# Patient Record
Sex: Female | Born: 1990 | Race: White | Hispanic: No | Marital: Married | Smoking: Former smoker
Health system: Southern US, Community
[De-identification: ages and names within clinical notes are randomized; demographics above are authoritative.]

## PROBLEM LIST (undated history)

## (undated) DIAGNOSIS — F32A Depression, unspecified: Secondary | ICD-10-CM

## (undated) DIAGNOSIS — D649 Anemia, unspecified: Secondary | ICD-10-CM

## (undated) DIAGNOSIS — O4290 Premature rupture of membranes, unspecified as to length of time between rupture and onset of labor, unspecified weeks of gestation: Secondary | ICD-10-CM

## (undated) DIAGNOSIS — O24419 Gestational diabetes mellitus in pregnancy, unspecified control: Secondary | ICD-10-CM

## (undated) DIAGNOSIS — Z30430 Encounter for insertion of intrauterine contraceptive device: Secondary | ICD-10-CM

## (undated) DIAGNOSIS — F329 Major depressive disorder, single episode, unspecified: Secondary | ICD-10-CM

## (undated) HISTORY — DX: Gestational diabetes mellitus in pregnancy, unspecified control: O24.419

## (undated) HISTORY — DX: Depression, unspecified: F32.A

## (undated) HISTORY — DX: Anemia, unspecified: D64.9

## (undated) HISTORY — DX: Encounter for insertion of intrauterine contraceptive device: Z30.430

## (undated) HISTORY — DX: Major depressive disorder, single episode, unspecified: F32.9

---
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
## 2008-04-02 ENCOUNTER — Other Ambulatory Visit: Admission: RE | Admit: 2008-04-02 | Discharge: 2008-04-02 | Payer: Self-pay | Admitting: Obstetrics and Gynecology

## 2008-10-22 ENCOUNTER — Emergency Department (HOSPITAL_COMMUNITY): Admission: EM | Admit: 2008-10-22 | Discharge: 2008-10-22 | Payer: Self-pay | Admitting: Family Medicine

## 2008-12-14 ENCOUNTER — Emergency Department (HOSPITAL_COMMUNITY): Admission: EM | Admit: 2008-12-14 | Discharge: 2008-12-14 | Payer: Self-pay | Admitting: Emergency Medicine

## 2010-03-13 ENCOUNTER — Emergency Department (HOSPITAL_COMMUNITY): Admission: EM | Admit: 2010-03-13 | Discharge: 2010-03-13 | Payer: Self-pay | Admitting: Family Medicine

## 2010-11-29 LAB — URINE CULTURE: Colony Count: >=100000

## 2010-11-29 LAB — POCT URINALYSIS DIP (DEVICE)
Bilirubin Urine: NEGATIVE
Glucose, UA: NEGATIVE mg/dL
Ketones, ur: NEGATIVE mg/dL
Nitrite: POSITIVE — AB
Protein, ur: >=300 mg/dL — AB
Specific Gravity, Urine: >=1.03 (ref 1.005–1.030)
Urobilinogen, UA: 0.2 mg/dL (ref 0.0–1.0)
pH: 6 (ref 5.0–8.0)

## 2010-11-29 LAB — POCT PREGNANCY, URINE: Preg Test, Ur: NEGATIVE

## 2010-12-23 LAB — POCT RAPID STREP A (OFFICE): Streptococcus, Group A Screen (Direct): NEGATIVE

## 2011-01-29 NOTE — Discharge Summary (Signed)
[place]. [facility]  Patient:    Priscilla Ryan, STRANG Visit Number: [id number] MRN: [id number]          Service Type: TRA Location: PEDS 6149 01 Attending Physician:  Trauma, Md Dictated by:   Eugenia Pancoast, P.A. Admit Date:  06/17/2001 Discharge Date: 06/18/2001   CC:         Sandria Bales. Ezzard Standing, M.D., admitting  Fonnie Mu, M.D., consulting   Discharge Summary  DATE OF BIRTH:  [birth year].  FINAL DIAGNOSES: 1. Closed head injury. 2. Post concussive.  HISTORY OF PRESENT ILLNESS:  This is an 20 year old female who was on a trailer and fell off the back of the trailer, hitting her head.  She had loss of consciousness when seen.  The patient does not remember the accident. She came to the emergency room in stable condition, although she vomited several times. CT scan of the head was negative.  She did have signs of postconcussive state with the nausea and vomiting.  Subsequently, she was admitted overnight.  HOSPITAL COURSE:  Fonnie Mu, M.D., was consulted to follow up with the patient.  He saw the patient and followed her during her stay and she would follow with him post discharge.  The patient was doing well on June 18, 2001.  At this time she is up and ambulating without difficulty. The nausea and vomiting has resolved.  She did eat breakfast without any problem.  She did not have any numbness or weakness of any note. Subsequently, at this time she presented for discharge.  FOLLOW-UP:  At the time of discharge, she was told to follow up with Dr. Clarene Duke within one week.  DISCHARGE INSTRUCTIONS:  The patient rides horses. She was told not to ride a horse until seen by Dr. Clarene Duke.  DISPOSITION:  The patient was subsequently discharged home on June 18, 2001, in satisfactory and stable condition.  DISCHARGE MEDICATIONS:  The patient is to take Tylenol p.r.n. for headache. Dictated by:   Eugenia Pancoast, P.A. Attending  Physician:  Trauma, Md DD:  06/18/01 TD:  06/19/01 Job: [id number]

## 2011-01-29 NOTE — H&P (Signed)
[place]. [facility]  Patient:    Priscilla Ryan, EDGIN Visit Number: [id number] MRN: [id number]          Service Type: TRA Location: [place] Attending Physician:  Trauma, Md Dictated by:   Sandria Bales. Ezzard Standing, M.D. Admit Date:  06/17/2001   CC:         Fonnie Mu, M.D.   History and Physical  DATE OF BIRTH:  03/20/1991.  HISTORY OF PRESENT ILLNESS:  This is an 20 year old white female patient of Dr. Thurston Pounds who fell off the back of a tractor, kind of [place] on a farm in [place].  She apparently struck her head, had a loss of consciousness, and does not remember the accident.  She presented, however, to the [facility] Emergency Room in stable condition, had nausea and vomiting multiple times since presentation.  She is now really waking up and is alert, cooperative, is oriented, and can give a good history.  She is accompanied by her mother and family in the room.  PAST MEDICAL HISTORY:  ALLERGIES:  None.  MEDICATIONS:  None.  REVIEW OF SYSTEMS:  Negative for pulmonary, cardiac, gastrointestinal, urologic problems.  She has never had a loss of consciousness before.  SOCIAL HISTORY:  She is a sixth grader in school and will be 10 in about two weeks.  PHYSICAL EXAMINATION:  VITAL SIGNS:  Pulse 89, blood pressure 105/44.  HEENT:  Pupils are equal and reactive to light.  Extraocular movements are good x 6.  She has a bruise over her left eye which is tender, but she has no evidence of any kind of eye entrapment.  Her TMs are unremarkable.  Her tongue is midline.  She has no oral injury.  NECK:  Soft, supple, without pain.  PULMONARY:  Equal breath sounds without chest contusion.  CARDIAC:  Regular rate and rhythm.  ABDOMEN:  Soft without tenderness and without any bruits or contusion.  MUSCULOSKELETAL:  She has no obvious long bone injury or pain.  NEUROLOGIC:  Grossly intact to motor and sensory function in her upper and lower  extremities.  Her deep tendon reflexes in biceps and patellar tendons are 2+ and equal.,  LABORATORY DATA:  CT scan of her head is negative.  IMPRESSION:  Closed head injury with negative CT scan, post concussive syndrome with some nausea and mild headache which is rapidly resolving.  PLAN:  I spoke with the mother and plan to admit and observe her for 24 hours. Will probably discharge in a.m. if all goes well. Dictated by:   Sandria Bales. Ezzard Standing, M.D. Attending Physician:  Trauma, Md DD:  06/17/01 TD:  06/17/01 Job: [id number]

## 2012-09-13 NOTE — L&D Delivery Note (Signed)
Delivery Note At 6:47 AM a viable and healthy female was delivered via Vaginal, Spontaneous Delivery (Presentation: ; Occiput Anterior).  APGAR: 8, 9; weight P   Placenta status: Intact, Spontaneous.  Cord: 3 vessels with the following complications: None.    Anesthesia: None  Episiotomy: None Lacerations: 1st degree Suture Repair: 3.0 vicryl rapide Est. Blood Loss (mL): 400cc  Mom to postpartum.  Baby to Couplet care / Skin to Skin.  BOVARD,Jaree Trinka 07/19/2013, 7:05 AM  Br/ Depo/ [place]

## 2012-12-06 ENCOUNTER — Encounter: Payer: Self-pay | Admitting: *Deleted

## 2012-12-06 ENCOUNTER — Telehealth: Payer: Self-pay | Admitting: Nurse Practitioner

## 2012-12-06 NOTE — Telephone Encounter (Signed)
PT REQ AN APPT FOR PREGNANCY TESTING AND STD TESTING PLEASE/S

## 2012-12-07 ENCOUNTER — Ambulatory Visit (INDEPENDENT_AMBULATORY_CARE_PROVIDER_SITE_OTHER): Payer: Commercial Managed Care - PPO | Admitting: Nurse Practitioner

## 2012-12-07 ENCOUNTER — Encounter: Payer: Self-pay | Admitting: Nurse Practitioner

## 2012-12-07 VITALS — BP 102/70 | HR 72 | Resp 18 | Ht 64.5 in | Wt 126.0 lb

## 2012-12-07 DIAGNOSIS — N912 Amenorrhea, unspecified: Secondary | ICD-10-CM

## 2012-12-07 NOTE — Patient Instructions (Addendum)
Prenatal Care   WHAT IS PRENATAL CARE?   Prenatal care means health care during your pregnancy, before your baby is born. Take care of yourself and your baby by:   · Getting early prenatal care. If you know you are pregnant, or think you might be pregnant, call your caregiver as soon as possible. Schedule a visit for a general/prenatal examination.  · Getting regular prenatal care. Follow your caregiver's schedule for blood and other necessary tests. Do not miss appointments.  · Do everything you can to keep yourself and your baby healthy during your pregnancy.  · Prenatal care should include evaluation of medical, dietary, educational, psychological, and social needs for the couple and the medical, surgical, and genetic history of the family of the mother and father.  · Discuss with your caregiver:  · Your medicines, prescription, over-the-counter, and herbal medicines.  · Substance abuse, alcohol, smoking, and illegal drugs.  · Domestic abuse and violence, if present.  · Your immunizations.  · Nutrition and diet.  · Exercising.  · Environment and occupational hazards, at home and at work.  · History of sexually transmitted disease, both you and your partner.  · Previous pregnancies.  WHY IS PRENATAL CARE SO IMPORTANT?   By seeing you regularly, your caregiver has the chance to find problems early, so that they can be treated as soon as possible. Other problems might be prevented. Many studies have shown that early and regular prenatal care is important for the health of both mothers and their babies.   I AM THINKING ABOUT GETTING PREGNANT. HOW CAN I TAKE CARE OF MYSELF?   Taking care of yourself before you get pregnant helps you to have a healthy pregnancy. It also lowers your chances of having a baby born with a birth defect. Here are ways to take care of yourself before you get pregnant:   · Eat healthy foods, exercise regularly (30 minutes per day for most days of the week is best), and get enough rest and  sleep. Talk to your caregiver about what kinds of foods and exercises are best for you.  · Take 400 micrograms (mcg) of folic acid (one of the B vitamins) every day. The best way to do this is to take a daily multivitamin pill that contains this amount of folic acid. Getting enough of the synthetic (manufactured) form of folic acid every day before you get pregnant and during early pregnancy can help prevent certain birth defects. Many breakfast cereals and other grain products have folic acid added to them, but only certain cereals contain 400 mcg of folic acid per serving. Check the label on your multivitamin or cereal to find the amount of folic acid in the food.  · See your caregiver for a complete check up before getting pregnant. Make sure that you have had all your immunization shots, especially for rubella (German measles). Rubella can cause serious birth defects. Chickenpox is another illness you want to avoid during pregnancy. If you have had chickenpox and rubella in the past, you should be immune to them.  · Tell your caregiver about any prescription or non-prescription medicines (including herbal remedies) you are taking. Some medicines are not safe to take during pregnancy.  · Stop smoking cigarettes, drinking alcohol, or taking illegal drugs. Ask your caregiver for help, if you need it. You can also get help with alcohol and drugs by talking with a member of your faith community, a counselor, or a trusted friend.  · Discuss   and treat any medical, social, or psychological problems before getting pregnant.  · Discuss any history of genetic problems in the mother, father, and their families. Do genetic testing before getting pregnant, when possible.  · Discuss any physical or emotional abuse with your caregiver.  · Discuss with your caregiver if you might be exposed to harmful chemicals on your job or where you live.  · Discuss with your caregiver if you think your job or the hours you work may be  harmful and should be changed.  · The father should be involved with the decision making and with all aspects of the pregnancy, labor, and delivery.  · If you have medical insurance, make sure you are covered for pregnancy.  I JUST FOUND OUT THAT I AM PREGNANT. HOW CAN I TAKE CARE OF MYSELF?   Here are ways to take care of yourself and the precious new life growing inside you:   · Continue taking your multivitamin with 400 micrograms (mcg) of folic acid every day.  · Get early and regular prenatal care. It does not matter if this is your first pregnancy or if you already have children. It is very important to see a caregiver during your pregnancy. Your caregiver will check at each visit to make sure that you and the baby are healthy. If there are any problems, action can be taken right away to help you and the baby.  · Eat a healthy diet that includes:  · Fruits.  · Vegetables.  · Foods low in saturated fat.  · Grains.  · Calcium-rich foods.  · Drink 6 to 8 glasses of liquids a day.  · Unless your caregiver tells you not to, try to be physically active for 30 minutes, most days of the week. If you are pressed for time, you can get your activity in through 10 minute segments, three times a day.  · If you smoke, drink alcohol, or use drugs, STOP. These can cause long-term damage to your baby. Talk with your caregiver about steps to take to stop smoking. Talk with a member of your faith community, a counselor, a trusted friend, or your caregiver if you are concerned about your alcohol or drug use.  · Ask your caregiver before taking any medicine, even over-the-counter medicines. Some medicines are not safe to take during pregnancy.  · Get plenty of rest and sleep.  · Avoid hot tubs and saunas during pregnancy.  · Do not have X-rays taken, unless absolutely necessary and with the recommendation of your caregiver. A lead shield can be placed on your abdomen, to protect the baby when X-rays are taken in other parts of the  body.  · Do not empty the cat litter when you are pregnant. It may contain a parasite that causes an infection called toxoplasmosis, which can cause birth defects. Also, use gloves when working in garden areas used by cats.  · Do not eat uncooked or undercooked cheese, meats, or fish.  · Stay away from toxic chemicals like:  · Insecticides.  · Solvents (some cleaners or paint thinners).  · Lead.  · Mercury.  · Sexual relations may continue until the end of the pregnancy, unless you have a medical problem or there is a problem with the pregnancy and your caregiver tells you not to.  · Do not wear high heel shoes, especially during the second half of the pregnancy. You can lose your balance and fall.  · Do not take long trips, unless   absolutely necessary. Be sure to see your caregiver before going on the trip.  · Do not sit in one position for more than 2 hours, when on a trip.  · Take a copy of your medical records when going on a trip.  · Know where there is a hospital in the city you are visiting, in case of an emergency.  · Most dangerous household products will have pregnancy warnings on their labels. Ask your caregiver about products if you are unsure.  · Limit or eliminate your caffeine intake from coffee, tea, sodas, medicines, and chocolate.  · Many women continue working through pregnancy. Staying active might help you stay healthier. If you have a question about the safety or the hours you work at your particular job, talk with your caregiver.  · Get informed:  · Read books.  · Watch videos.  · Go to childbirth classes for you and the father.  · Talk with experienced moms.  · Ask your caregiver about childbirth education classes for you and your partner. Classes can help you and your partner prepare for the birth of your baby.  · Ask about a pediatrician (baby doctor) and methods and pain medicine for labor, delivery, and possible Cesarean delivery (C-section).  I AM NOT THINKING ABOUT GETTING PREGNANT  RIGHT NOW, BUT HEARD THAT ALL WOMEN SHOULD TAKE FOLIC ACID EVERY DAY?   All women of childbearing age, with even a remote chance of getting pregnant, should try to make sure they get enough folic acid. Many pregnancies are not planned. Many women do not know they are actually pregnant early in their pregnancies, and certain birth defects happen in the very early part of pregnancy. Taking 400 micrograms (mcg) of folic acid every day will help prevent certain birth defects that happen in the early part of pregnancy. If a woman begins taking vitamin pills in the second or third month of pregnancy, it may be too late to prevent birth defects. Folic acid may also have other health benefits for women, besides preventing birth defects.   HOW OFTEN SHOULD I SEE MY CAREGIVER DURING PREGNANCY?   Your caregiver will give you a schedule for your prenatal visits. You will have visits more often as you get closer to the end of your pregnancy. An average pregnancy lasts about 40 weeks.   A typical schedule includes visiting your caregiver:   · About once each month, during your first 6 months of pregnancy.  · Every 2 weeks, during the next 2 months.  · Weekly in the last month, until the delivery date.  Your caregiver will probably want to see you more often if:  · You are over 35.  · Your pregnancy is high risk, because you have certain health problems or problems with the pregnancy, such as:  · Diabetes.  · High blood pressure.  · The baby is not growing on schedule, according to the dates of the pregnancy.  Your caregiver will do special tests, to make sure you and the baby are not having any serious problems.  WHAT HAPPENS DURING PRENATAL VISITS?   · At your first prenatal visit, your caregiver will talk to you about you and your partner's health history and your family's health history, and will do a physical exam.  · On your first visit, a physical exam will include checks of your blood pressure, height and weight, and an  exam of your pelvic organs. Your caregiver will do a Pap test if you have   not had one recently, and will do cultures of your cervix to make sure there is no infection.  · At each visit, there will be tests of your blood, urine, blood pressure, weight, and checking the progress of the baby.  · Your caregiver will be able to tell you when to expect that your baby will be born.  · Each visit is also a chance for you to learn about staying healthy during pregnancy and for asking questions.  · Discuss whether you will be breastfeeding.  · At your later prenatal visits, your caregiver will check how you are doing and how the baby is developing. You may have a number of tests done as your pregnancy progresses.  · Ultrasound exams are often used to check on the baby's growth and health.  · You may have more urine and blood tests, as well as special tests, if needed. These may include amniocentesis (examine fluid in the pregnancy sac), stress tests (check how baby responds to contractions), biophysical profile (measures fetus well-being). Your caregiver will explain the tests and why they are necessary.  I AM IN MY LATE THIRTIES, AND I WANT TO HAVE A CHILD NOW. SHOULD I DO ANYTHING SPECIAL?   As you get older, there is more chance of having a medical problem (high blood pressure), pregnancy problem (preeclampsia, problems with the placenta), miscarriage, or a baby born with a birth defect. However, most women in their late thirties and early forties have healthy babies. See your caregiver on a regular basis before you get pregnant and be sure to go for exams throughout your pregnancy. Your caregiver probably will want to do some special tests to check on you and your baby's health when you are pregnant.   Women today are often delaying having children until later in life, when they are in their thirties and forties. While many women in their thirties and forties have no difficulty getting pregnant, fertility does decline  with age. For women over 40 who cannot get pregnant after 6 months of trying, it is recommended that they see their caregiver for a fertility evaluation. It is not uncommon to have trouble becoming pregnant or experience infertility (inability to become pregnant after trying for one year). If you think that you or your partner may be infertile, you can discuss this with your caregiver. He or she can recommend treatments such as drugs, surgery, or assisted reproductive technology.   Document Released: 09/02/2003 Document Revised: 11/22/2011 Document Reviewed: 07/30/2009  Exit[employer]® Patient Information ©2013 [employer].

## 2012-12-07 NOTE — Progress Notes (Signed)
  22 y.o.Single Caucasian female G1 ,A1 presents with no menses sinceFebruary 20, 2014  Currently periods are occurring every 28 days.   Bleeding is moderate.  Periods were regular in the past, occurring every 28 days.  Patient reports no abdominal pain, nausea/ vomiting, or vaginal bleeding . Reports no method of birth control X 3 months.   Chose not to use birth control and if pregnancy occurred OK. She has a new partner X 6 months and they are well suited for one another.  Urine pregnancy positive at home.    A comprehensive review of systems was negative.  No exam performed today and  not indicated at this time.  Assessment:    Amenorrhea with + UPT   Labs:  UPT + here  Will schedule PUS for confirmation of viable pregnancy about 12/21/12  Pt. Is given a list of OTC med's same to use in pregnancy  Discussed if any problems with pain or bleeding to call ASAP  She will start on Prenatal MVI  Given list of OB Md's - she is concerned about cost and insurance coverage during pregnancy and may need to apply for financial help

## 2012-12-08 NOTE — Progress Notes (Signed)
Encounter reviewed by Dr. Brook Silva.  

## 2012-12-18 ENCOUNTER — Other Ambulatory Visit: Payer: Self-pay | Admitting: Obstetrics and Gynecology

## 2012-12-18 DIAGNOSIS — N912 Amenorrhea, unspecified: Secondary | ICD-10-CM

## 2012-12-25 ENCOUNTER — Encounter: Payer: Self-pay | Admitting: Obstetrics and Gynecology

## 2012-12-25 ENCOUNTER — Ambulatory Visit (INDEPENDENT_AMBULATORY_CARE_PROVIDER_SITE_OTHER): Payer: 59 | Admitting: Obstetrics and Gynecology

## 2012-12-25 ENCOUNTER — Ambulatory Visit (INDEPENDENT_AMBULATORY_CARE_PROVIDER_SITE_OTHER): Payer: 59

## 2012-12-25 VITALS — BP 98/60 | Ht 63.5 in | Wt 123.0 lb

## 2012-12-25 DIAGNOSIS — N912 Amenorrhea, unspecified: Secondary | ICD-10-CM

## 2012-12-25 DIAGNOSIS — Z1389 Encounter for screening for other disorder: Secondary | ICD-10-CM

## 2012-12-25 DIAGNOSIS — O9989 Other specified diseases and conditions complicating pregnancy, childbirth and the puerperium: Secondary | ICD-10-CM

## 2012-12-25 DIAGNOSIS — Z3490 Encounter for supervision of normal pregnancy, unspecified, unspecified trimester: Secondary | ICD-10-CM

## 2012-12-25 LAB — US OB TRANSVAGINAL

## 2012-12-25 NOTE — Patient Instructions (Addendum)
Prenatal Care   WHAT IS PRENATAL CARE?   Prenatal care means health care during your pregnancy, before your baby is born. Take care of yourself and your baby by:   · Getting early prenatal care. If you know you are pregnant, or think you might be pregnant, call your caregiver as soon as possible. Schedule a visit for a general/prenatal examination.  · Getting regular prenatal care. Follow your caregiver's schedule for blood and other necessary tests. Do not miss appointments.  · Do everything you can to keep yourself and your baby healthy during your pregnancy.  · Prenatal care should include evaluation of medical, dietary, educational, psychological, and social needs for the couple and the medical, surgical, and genetic history of the family of the mother and father.  · Discuss with your caregiver:  · Your medicines, prescription, over-the-counter, and herbal medicines.  · Substance abuse, alcohol, smoking, and illegal drugs.  · Domestic abuse and violence, if present.  · Your immunizations.  · Nutrition and diet.  · Exercising.  · Environment and occupational hazards, at home and at work.  · History of sexually transmitted disease, both you and your partner.  · Previous pregnancies.  WHY IS PRENATAL CARE SO IMPORTANT?   By seeing you regularly, your caregiver has the chance to find problems early, so that they can be treated as soon as possible. Other problems might be prevented. Many studies have shown that early and regular prenatal care is important for the health of both mothers and their babies.   I AM THINKING ABOUT GETTING PREGNANT. HOW CAN I TAKE CARE OF MYSELF?   Taking care of yourself before you get pregnant helps you to have a healthy pregnancy. It also lowers your chances of having a baby born with a birth defect. Here are ways to take care of yourself before you get pregnant:   · Eat healthy foods, exercise regularly (30 minutes per day for most days of the week is best), and get enough rest and  sleep. Talk to your caregiver about what kinds of foods and exercises are best for you.  · Take 400 micrograms (mcg) of folic acid (one of the B vitamins) every day. The best way to do this is to take a daily multivitamin pill that contains this amount of folic acid. Getting enough of the synthetic (manufactured) form of folic acid every day before you get pregnant and during early pregnancy can help prevent certain birth defects. Many breakfast cereals and other grain products have folic acid added to them, but only certain cereals contain 400 mcg of folic acid per serving. Check the label on your multivitamin or cereal to find the amount of folic acid in the food.  · See your caregiver for a complete check up before getting pregnant. Make sure that you have had all your immunization shots, especially for rubella (German measles). Rubella can cause serious birth defects. Chickenpox is another illness you want to avoid during pregnancy. If you have had chickenpox and rubella in the past, you should be immune to them.  · Tell your caregiver about any prescription or non-prescription medicines (including herbal remedies) you are taking. Some medicines are not safe to take during pregnancy.  · Stop smoking cigarettes, drinking alcohol, or taking illegal drugs. Ask your caregiver for help, if you need it. You can also get help with alcohol and drugs by talking with a member of your faith community, a counselor, or a trusted friend.  · Discuss   and treat any medical, social, or psychological problems before getting pregnant.  · Discuss any history of genetic problems in the mother, father, and their families. Do genetic testing before getting pregnant, when possible.  · Discuss any physical or emotional abuse with your caregiver.  · Discuss with your caregiver if you might be exposed to harmful chemicals on your job or where you live.  · Discuss with your caregiver if you think your job or the hours you work may be  harmful and should be changed.  · The father should be involved with the decision making and with all aspects of the pregnancy, labor, and delivery.  · If you have medical insurance, make sure you are covered for pregnancy.  I JUST FOUND OUT THAT I AM PREGNANT. HOW CAN I TAKE CARE OF MYSELF?   Here are ways to take care of yourself and the precious new life growing inside you:   · Continue taking your multivitamin with 400 micrograms (mcg) of folic acid every day.  · Get early and regular prenatal care. It does not matter if this is your first pregnancy or if you already have children. It is very important to see a caregiver during your pregnancy. Your caregiver will check at each visit to make sure that you and the baby are healthy. If there are any problems, action can be taken right away to help you and the baby.  · Eat a healthy diet that includes:  · Fruits.  · Vegetables.  · Foods low in saturated fat.  · Grains.  · Calcium-rich foods.  · Drink 6 to 8 glasses of liquids a day.  · Unless your caregiver tells you not to, try to be physically active for 30 minutes, most days of the week. If you are pressed for time, you can get your activity in through 10 minute segments, three times a day.  · If you smoke, drink alcohol, or use drugs, STOP. These can cause long-term damage to your baby. Talk with your caregiver about steps to take to stop smoking. Talk with a member of your faith community, a counselor, a trusted friend, or your caregiver if you are concerned about your alcohol or drug use.  · Ask your caregiver before taking any medicine, even over-the-counter medicines. Some medicines are not safe to take during pregnancy.  · Get plenty of rest and sleep.  · Avoid hot tubs and saunas during pregnancy.  · Do not have X-rays taken, unless absolutely necessary and with the recommendation of your caregiver. A lead shield can be placed on your abdomen, to protect the baby when X-rays are taken in other parts of the  body.  · Do not empty the cat litter when you are pregnant. It may contain a parasite that causes an infection called toxoplasmosis, which can cause birth defects. Also, use gloves when working in garden areas used by cats.  · Do not eat uncooked or undercooked cheese, meats, or fish.  · Stay away from toxic chemicals like:  · Insecticides.  · Solvents (some cleaners or paint thinners).  · Lead.  · Mercury.  · Sexual relations may continue until the end of the pregnancy, unless you have a medical problem or there is a problem with the pregnancy and your caregiver tells you not to.  · Do not wear high heel shoes, especially during the second half of the pregnancy. You can lose your balance and fall.  · Do not take long trips, unless   absolutely necessary. Be sure to see your caregiver before going on the trip.  · Do not sit in one position for more than 2 hours, when on a trip.  · Take a copy of your medical records when going on a trip.  · Know where there is a hospital in the city you are visiting, in case of an emergency.  · Most dangerous household products will have pregnancy warnings on their labels. Ask your caregiver about products if you are unsure.  · Limit or eliminate your caffeine intake from coffee, tea, sodas, medicines, and chocolate.  · Many women continue working through pregnancy. Staying active might help you stay healthier. If you have a question about the safety or the hours you work at your particular job, talk with your caregiver.  · Get informed:  · Read books.  · Watch videos.  · Go to childbirth classes for you and the father.  · Talk with experienced moms.  · Ask your caregiver about childbirth education classes for you and your partner. Classes can help you and your partner prepare for the birth of your baby.  · Ask about a pediatrician (baby doctor) and methods and pain medicine for labor, delivery, and possible Cesarean delivery (C-section).  I AM NOT THINKING ABOUT GETTING PREGNANT  RIGHT NOW, BUT HEARD THAT ALL WOMEN SHOULD TAKE FOLIC ACID EVERY DAY?   All women of childbearing age, with even a remote chance of getting pregnant, should try to make sure they get enough folic acid. Many pregnancies are not planned. Many women do not know they are actually pregnant early in their pregnancies, and certain birth defects happen in the very early part of pregnancy. Taking 400 micrograms (mcg) of folic acid every day will help prevent certain birth defects that happen in the early part of pregnancy. If a woman begins taking vitamin pills in the second or third month of pregnancy, it may be too late to prevent birth defects. Folic acid may also have other health benefits for women, besides preventing birth defects.   HOW OFTEN SHOULD I SEE MY CAREGIVER DURING PREGNANCY?   Your caregiver will give you a schedule for your prenatal visits. You will have visits more often as you get closer to the end of your pregnancy. An average pregnancy lasts about 40 weeks.   A typical schedule includes visiting your caregiver:   · About once each month, during your first 6 months of pregnancy.  · Every 2 weeks, during the next 2 months.  · Weekly in the last month, until the delivery date.  Your caregiver will probably want to see you more often if:  · You are over 35.  · Your pregnancy is high risk, because you have certain health problems or problems with the pregnancy, such as:  · Diabetes.  · High blood pressure.  · The baby is not growing on schedule, according to the dates of the pregnancy.  Your caregiver will do special tests, to make sure you and the baby are not having any serious problems.  WHAT HAPPENS DURING PRENATAL VISITS?   · At your first prenatal visit, your caregiver will talk to you about you and your partner's health history and your family's health history, and will do a physical exam.  · On your first visit, a physical exam will include checks of your blood pressure, height and weight, and an  exam of your pelvic organs. Your caregiver will do a Pap test if you have   not had one recently, and will do cultures of your cervix to make sure there is no infection.  · At each visit, there will be tests of your blood, urine, blood pressure, weight, and checking the progress of the baby.  · Your caregiver will be able to tell you when to expect that your baby will be born.  · Each visit is also a chance for you to learn about staying healthy during pregnancy and for asking questions.  · Discuss whether you will be breastfeeding.  · At your later prenatal visits, your caregiver will check how you are doing and how the baby is developing. You may have a number of tests done as your pregnancy progresses.  · Ultrasound exams are often used to check on the baby's growth and health.  · You may have more urine and blood tests, as well as special tests, if needed. These may include amniocentesis (examine fluid in the pregnancy sac), stress tests (check how baby responds to contractions), biophysical profile (measures fetus well-being). Your caregiver will explain the tests and why they are necessary.  I AM IN MY LATE THIRTIES, AND I WANT TO HAVE A CHILD NOW. SHOULD I DO ANYTHING SPECIAL?   As you get older, there is more chance of having a medical problem (high blood pressure), pregnancy problem (preeclampsia, problems with the placenta), miscarriage, or a baby born with a birth defect. However, most women in their late thirties and early forties have healthy babies. See your caregiver on a regular basis before you get pregnant and be sure to go for exams throughout your pregnancy. Your caregiver probably will want to do some special tests to check on you and your baby's health when you are pregnant.   Women today are often delaying having children until later in life, when they are in their thirties and forties. While many women in their thirties and forties have no difficulty getting pregnant, fertility does decline  with age. For women over 40 who cannot get pregnant after 6 months of trying, it is recommended that they see their caregiver for a fertility evaluation. It is not uncommon to have trouble becoming pregnant or experience infertility (inability to become pregnant after trying for one year). If you think that you or your partner may be infertile, you can discuss this with your caregiver. He or she can recommend treatments such as drugs, surgery, or assisted reproductive technology.   Document Released: 09/02/2003 Document Revised: 11/22/2011 Document Reviewed: 07/30/2009  Exit[employer]® Patient Information ©2013 [employer].

## 2012-12-25 NOTE — Progress Notes (Signed)
  SUBJECTIVE  Patient here for ultrasound to document viability of pregnancy.  States some nausea.  Able to tolerate PNV.  OBJECTIVE  See ultrasound below.  Viable IUP - 6 + 6 weeks.  EDC 08/14/13.  ASSESSMENT  Viable IUP. Nausea of early pregnancy.  PLAN  Patient will select OB office.   I discussed avoidance of exposures with patient. Patient will try Vitamin B6 25 mg, 1/2 tab daily for nausea. Return to office here in 1 year.

## 2013-01-01 ENCOUNTER — Encounter: Payer: Self-pay | Admitting: *Deleted

## 2013-01-11 LAB — OB RESULTS CONSOLE RPR: RPR: NONREACTIVE

## 2013-01-11 LAB — OB RESULTS CONSOLE GC/CHLAMYDIA
Chlamydia: NEGATIVE
Gonorrhea: NEGATIVE

## 2013-01-11 LAB — OB RESULTS CONSOLE HEPATITIS B SURFACE ANTIGEN: Hepatitis B Surface Ag: NEGATIVE

## 2013-01-11 LAB — OB RESULTS CONSOLE ANTIBODY SCREEN: Antibody Screen: NEGATIVE

## 2013-01-25 ENCOUNTER — Ambulatory Visit: Payer: Self-pay | Admitting: Nurse Practitioner

## 2013-02-20 ENCOUNTER — Ambulatory Visit: Payer: Self-pay | Admitting: Nurse Practitioner

## 2013-06-27 ENCOUNTER — Encounter: Payer: 59 | Attending: Obstetrics and Gynecology

## 2013-06-27 VITALS — Ht 65.0 in | Wt 149.1 lb

## 2013-06-27 DIAGNOSIS — Z713 Dietary counseling and surveillance: Secondary | ICD-10-CM | POA: Insufficient documentation

## 2013-06-27 DIAGNOSIS — O99810 Abnormal glucose complicating pregnancy: Secondary | ICD-10-CM | POA: Insufficient documentation

## 2013-06-28 NOTE — Progress Notes (Signed)
  Patient was seen on 06/27/13 for Gestational Diabetes self-management class at the [facility]. The following learning objectives were met by the patient during this course:   States the definition of Gestational Diabetes  States why dietary management is important in controlling blood glucose  Describes the effects of carbohydrates on blood glucose levels  Demonstrates ability to create a balanced meal plan  Demonstrates carbohydrate counting   States when to check blood glucose levels  Demonstrates proper blood glucose monitoring techniques  States the effect of stress and exercise on blood glucose levels  States the importance of limiting caffeine and abstaining from alcohol and smoking  Plan:  Aim for 2 Carb Choices per meal (30 grams) +/- 1 either way for breakfast Aim for 3 Carb Choices per meal (45 grams) +/- 1 either way from lunch and dinner Aim for 1-2 Carbs per snack Begin reading food labels for Total Carbohydrate and sugar grams of foods Consider  increasing your activity level by walking daily as tolerated Begin checking BG before breakfast and 1-2 hours after first bit of breakfast, lunch and dinner after  as directed by MD  Take medication  as directed by MD  Blood glucose monitor given: Accu Chek Nano BG Monitoring Kit Lot # [id number] Exp: 08/12/14 Blood glucose reading: 97  Patient instructed to monitor glucose levels: FBS: 60 - <90 1 hour: <140 2 hour: <120  Patient received the following handouts:  Nutrition Diabetes and Pregnancy  Carbohydrate Counting List  Meal Planning worksheet  Patient will be seen for follow-up as needed.

## 2013-07-12 LAB — OB RESULTS CONSOLE GBS: GBS: NEGATIVE

## 2013-07-18 ENCOUNTER — Inpatient Hospital Stay (HOSPITAL_COMMUNITY)
Admission: AD | Admit: 2013-07-18 | Discharge: 2013-07-21 | DRG: 775 | Disposition: A | Discharge status: Home / Self Care | Payer: 59 | Source: Ambulatory Visit | Attending: Obstetrics and Gynecology | Admitting: Obstetrics and Gynecology

## 2013-07-18 ENCOUNTER — Encounter (HOSPITAL_COMMUNITY): Payer: Self-pay

## 2013-07-18 DIAGNOSIS — O99814 Abnormal glucose complicating childbirth: Principal | ICD-10-CM | POA: Diagnosis present

## 2013-07-18 DIAGNOSIS — O4290 Premature rupture of membranes, unspecified as to length of time between rupture and onset of labor, unspecified weeks of gestation: Secondary | ICD-10-CM

## 2013-07-18 HISTORY — DX: Premature rupture of membranes, unspecified as to length of time between rupture and onset of labor, unspecified weeks of gestation: O42.90

## 2013-07-18 NOTE — MAU Note (Signed)
Several gushes of clear fluid today.

## 2013-07-19 ENCOUNTER — Encounter (HOSPITAL_COMMUNITY): Payer: Self-pay | Admitting: *Deleted

## 2013-07-19 DIAGNOSIS — O4290 Premature rupture of membranes, unspecified as to length of time between rupture and onset of labor, unspecified weeks of gestation: Secondary | ICD-10-CM

## 2013-07-19 HISTORY — DX: Premature rupture of membranes, unspecified as to length of time between rupture and onset of labor, unspecified weeks of gestation: O42.90

## 2013-07-19 LAB — CBC
HCT: 33.1 % — ABNORMAL LOW (ref 36.0–46.0)
Hemoglobin: 11.4 g/dL — ABNORMAL LOW (ref 12.0–15.0)
MCH: 30.6 pg (ref 26.0–34.0)
MCHC: 34.4 g/dL (ref 30.0–36.0)
MCV: 89 fL (ref 78.0–100.0)
RBC: 3.72 MIL/uL — ABNORMAL LOW (ref 3.87–5.11)
WBC: 7 10*3/uL (ref 4.0–10.5)

## 2013-07-19 LAB — GLUCOSE, CAPILLARY
Glucose-Capillary: 104 mg/dL — ABNORMAL HIGH (ref 70–99)
Glucose-Capillary: 108 mg/dL — ABNORMAL HIGH (ref 70–99)

## 2013-07-19 MED ORDER — DIPHENHYDRAMINE HCL 50 MG/ML IJ SOLN: 12.5000 mg | INTRAMUSCULAR | Status: DC | PRN

## 2013-07-19 MED ORDER — ZOLPIDEM TARTRATE 5 MG PO TABS: 5.0000 mg | ORAL_TABLET | Freq: Every evening | ORAL | Status: DC | PRN

## 2013-07-19 MED ORDER — NALOXONE HCL 0.4 MG/ML IJ SOLN
INTRAMUSCULAR | Status: AC
  Filled 2013-07-19: qty 1

## 2013-07-19 MED ORDER — PRENATAL MULTIVITAMIN CH
1.0000 | ORAL_TABLET | Freq: Every day | ORAL | Status: DC
  Administered 2013-07-19 – 2013-07-21 (×3): 1 via ORAL
  Filled 2013-07-19 (×3): qty 1

## 2013-07-19 MED ORDER — BUTORPHANOL TARTRATE 1 MG/ML IJ SOLN
1.0000 mg | INTRAMUSCULAR | Status: DC | PRN
  Administered 2013-07-19 (×3): 1 mg via INTRAVENOUS
  Filled 2013-07-19 (×3): qty 1

## 2013-07-19 MED ORDER — LANOLIN HYDROUS EX OINT: TOPICAL_OINTMENT | CUTANEOUS | Status: DC | PRN

## 2013-07-19 MED ORDER — OXYTOCIN BOLUS FROM INFUSION
500.0000 mL | INTRAVENOUS | Status: DC
  Administered 2013-07-19: 500 mL via INTRAVENOUS

## 2013-07-19 MED ORDER — LIDOCAINE HCL (PF) 1 % IJ SOLN
30.0000 mL | INTRAMUSCULAR | Status: AC | PRN
  Administered 2013-07-19: 30 mL via SUBCUTANEOUS
  Filled 2013-07-19 (×2): qty 30

## 2013-07-19 MED ORDER — OXYTOCIN 40 UNITS IN LACTATED RINGERS INFUSION - SIMPLE MED
62.5000 mL/h | INTRAVENOUS | Status: DC
  Administered 2013-07-19: 62.5 mL/h via INTRAVENOUS
  Filled 2013-07-19: qty 1000

## 2013-07-19 MED ORDER — SENNOSIDES-DOCUSATE SODIUM 8.6-50 MG PO TABS
2.0000 | ORAL_TABLET | ORAL | Status: DC
  Administered 2013-07-20 – 2013-07-21 (×2): 2 via ORAL
  Filled 2013-07-19 (×2): qty 2

## 2013-07-19 MED ORDER — ACETAMINOPHEN 325 MG PO TABS: 650.0000 mg | ORAL_TABLET | ORAL | Status: DC | PRN

## 2013-07-19 MED ORDER — FLEET ENEMA 7-19 GM/118ML RE ENEM: 1.0000 | ENEMA | RECTAL | Status: DC | PRN

## 2013-07-19 MED ORDER — CITRIC ACID-SODIUM CITRATE 334-500 MG/5ML PO SOLN
30.0000 mL | ORAL | Status: DC | PRN
  Administered 2013-07-19: 30 mL via ORAL
  Filled 2013-07-19: qty 15

## 2013-07-19 MED ORDER — EPHEDRINE 5 MG/ML INJ
10.0000 mg | INTRAVENOUS | Status: DC | PRN
  Filled 2013-07-19: qty 2

## 2013-07-19 MED ORDER — ONDANSETRON HCL 4 MG/2ML IJ SOLN: 4.0000 mg | Freq: Four times a day (QID) | INTRAMUSCULAR | Status: DC | PRN

## 2013-07-19 MED ORDER — OXYTOCIN 40 UNITS IN LACTATED RINGERS INFUSION - SIMPLE MED
1.0000 m[IU]/min | INTRAVENOUS | Status: DC
  Administered 2013-07-19: 2 m[IU]/min via INTRAVENOUS

## 2013-07-19 MED ORDER — BUTORPHANOL TARTRATE 1 MG/ML IJ SOLN: 2.0000 mg | INTRAMUSCULAR | Status: DC | PRN

## 2013-07-19 MED ORDER — FENTANYL 2.5 MCG/ML BUPIVACAINE 1/10 % EPIDURAL INFUSION (WH - ANES): 14.0000 mL/h | INTRAMUSCULAR | Status: DC | PRN

## 2013-07-19 MED ORDER — IBUPROFEN 600 MG PO TABS
600.0000 mg | ORAL_TABLET | Freq: Four times a day (QID) | ORAL | Status: DC
  Administered 2013-07-19 – 2013-07-21 (×9): 600 mg via ORAL
  Filled 2013-07-19 (×9): qty 1

## 2013-07-19 MED ORDER — DIPHENHYDRAMINE HCL 25 MG PO CAPS: 25.0000 mg | ORAL_CAPSULE | Freq: Four times a day (QID) | ORAL | Status: DC | PRN

## 2013-07-19 MED ORDER — PHENYLEPHRINE 40 MCG/ML (10ML) SYRINGE FOR IV PUSH (FOR BLOOD PRESSURE SUPPORT)
80.0000 ug | PREFILLED_SYRINGE | INTRAVENOUS | Status: DC | PRN
  Filled 2013-07-19: qty 2

## 2013-07-19 MED ORDER — WITCH HAZEL-GLYCERIN EX PADS: 1.0000 "application " | MEDICATED_PAD | CUTANEOUS | Status: DC | PRN

## 2013-07-19 MED ORDER — LACTATED RINGERS IV SOLN: INTRAVENOUS | Status: DC

## 2013-07-19 MED ORDER — TERBUTALINE SULFATE 1 MG/ML IJ SOLN: 0.2500 mg | Freq: Once | INTRAMUSCULAR | Status: DC | PRN

## 2013-07-19 MED ORDER — LACTATED RINGERS IV SOLN
INTRAVENOUS | Status: DC
  Administered 2013-07-19 (×2): via INTRAVENOUS

## 2013-07-19 MED ORDER — BENZOCAINE-MENTHOL 20-0.5 % EX AERO
1.0000 "application " | INHALATION_SPRAY | CUTANEOUS | Status: DC | PRN
  Administered 2013-07-19: 1 via TOPICAL
  Filled 2013-07-19: qty 56

## 2013-07-19 MED ORDER — SIMETHICONE 80 MG PO CHEW: 80.0000 mg | CHEWABLE_TABLET | ORAL | Status: DC | PRN

## 2013-07-19 MED ORDER — PHENYLEPHRINE 40 MCG/ML (10ML) SYRINGE FOR IV PUSH (FOR BLOOD PRESSURE SUPPORT)
80.0000 ug | PREFILLED_SYRINGE | INTRAVENOUS | Status: DC | PRN
Start: 2013-07-19 — End: 2013-07-21
  Filled 2013-07-19: qty 2

## 2013-07-19 MED ORDER — DIBUCAINE 1 % RE OINT: 1.0000 "application " | TOPICAL_OINTMENT | RECTAL | Status: DC | PRN

## 2013-07-19 MED ORDER — IBUPROFEN 600 MG PO TABS
600.0000 mg | ORAL_TABLET | Freq: Four times a day (QID) | ORAL | Status: DC | PRN
  Administered 2013-07-19: 600 mg via ORAL
  Filled 2013-07-19: qty 1

## 2013-07-19 MED ORDER — MEDROXYPROGESTERONE ACETATE 150 MG/ML IM SUSP: 150.0000 mg | INTRAMUSCULAR | Status: DC | PRN

## 2013-07-19 MED ORDER — ONDANSETRON HCL 4 MG/2ML IJ SOLN: 4.0000 mg | INTRAMUSCULAR | Status: DC | PRN

## 2013-07-19 MED ORDER — TETANUS-DIPHTH-ACELL PERTUSSIS 5-2.5-18.5 LF-MCG/0.5 IM SUSP
0.5000 mL | Freq: Once | INTRAMUSCULAR | Status: DC
  Filled 2013-07-19: qty 0.5

## 2013-07-19 MED ORDER — LACTATED RINGERS IV SOLN
500.0000 mL | INTRAVENOUS | Status: DC | PRN
  Administered 2013-07-19 (×2): 300 mL via INTRAVENOUS

## 2013-07-19 MED ORDER — ONDANSETRON HCL 4 MG PO TABS: 4.0000 mg | ORAL_TABLET | ORAL | Status: DC | PRN

## 2013-07-19 MED ORDER — OXYCODONE-ACETAMINOPHEN 5-325 MG PO TABS
1.0000 | ORAL_TABLET | ORAL | Status: DC | PRN
  Administered 2013-07-19: 1 via ORAL
  Filled 2013-07-19: qty 1

## 2013-07-19 MED ORDER — OXYCODONE-ACETAMINOPHEN 5-325 MG PO TABS
1.0000 | ORAL_TABLET | ORAL | Status: DC | PRN
  Administered 2013-07-19 – 2013-07-20 (×4): 1 via ORAL
  Filled 2013-07-19 (×4): qty 1

## 2013-07-19 MED ORDER — LACTATED RINGERS IV SOLN: 500.0000 mL | Freq: Once | INTRAVENOUS | Status: DC

## 2013-07-19 NOTE — Progress Notes (Signed)
Patient was referred for history of depression/anxiety. * Referral screened out by Clinical Social Worker because none of the following criteria appear to apply: ~ History of anxiety/depression during this pregnancy, or of post-partum depression. ~ Diagnosis of anxiety and/or depression within last 3 years ~ History of depression due to pregnancy loss/loss of child OR * Patient's symptoms currently being treated with medication and/or therapy. Please contact the Clinical Social Worker if needs arise, or if patient requests.  Nursing Admission Summary notes hx at age 22. 

## 2013-07-19 NOTE — H&P (Signed)
Priscilla Ryan is a 22 y.o. female G1P1001 at 36+ with ROM.  Clear.  Pt had several gushes.  Pregnancy complicated by GDM, diet controlled.  GBBS neg.  +FM, no VB - except some in LOF, occ ctx. Otherwise relatively uncomplicated PNC - was 1cm in office on SVE today, same on presentation to MAU.  ROM confirmed.   Maternal Medical History:  Reason for admission: Rupture of membranes.   Contractions: Frequency: irregular.   Perceived severity is moderate.    Fetal activity: Perceived fetal activity is normal.    Prenatal complications: no prenatal complications Prenatal Complications - Diabetes: gestational. Diabetes is managed by diet.      OB History   Grav Para Term Preterm Abortions TAB SAB Ect Mult Living   1             G1 present No STDs Past Medical History  Diagnosis Date  . Depression age 68    took Lexapro & had reaction with extreme fatigue no meds for 6 yrs.  . Gestational diabetes     diet controlled  . ROM (rupture of membranes), premature 07/19/2013  HA History reviewed. No pertinent past surgical history. Family History: family history includes Heart failure in her maternal grandfather.CAD, HTN, Alcoholism Social History:  reports that she quit smoking about a year ago. She has never used smokeless tobacco. She reports that she does not drink alcohol. Her drug history is not on file.single Meds PNV All NKDA   Prenatal Transfer Tool  Maternal Diabetes: Yes:  Diabetes Type:  Diet controlled Genetic Screening: Normal Maternal Ultrasounds/Referrals: Normal Fetal Ultrasounds or other Referrals:  None Maternal Substance Abuse:  No; h/o tob Significant Maternal Medications:  None Significant Maternal Lab Results:  Lab values include: Group B Strep negative Other Comments:  None  Review of Systems  Constitutional: Negative.   HENT: Negative.   Eyes: Negative.   Respiratory: Negative.   Cardiovascular: Negative.   Genitourinary: Negative.   Musculoskeletal:  Negative.   Skin: Negative.   Neurological: Negative.   Psychiatric/Behavioral: Negative.     Dilation: 3 Effacement (%): 100 Station: -1 Exam by:: [employer] Blood pressure 128/80, pulse 81, temperature 97.5 F (36.4 C), temperature source Oral, resp. rate 18, height 5\' 5"  (1.651 m), weight 69.4 kg (153 lb), last menstrual period 10/30/2012, SpO2 98.00%. Maternal Exam:  Uterine Assessment: Contraction frequency is irregular.   Abdomen: Fundal height is appropriate for gestation.   Estimated fetal weight is 6-7#.   Fetal presentation: vertex  Introitus: Normal vulva. Normal vagina.  Amniotic fluid character: clear.  Pelvis: adequate for delivery.   Cervix: Cervix evaluated by digital exam.     Physical Exam  Constitutional: She is oriented to person, place, and time. She appears well-developed and well-nourished.  HENT:  Head: Normocephalic and atraumatic.  Cardiovascular: Normal rate and regular rhythm.   Respiratory: Effort normal and breath sounds normal. No respiratory distress. She has no wheezes.  GI: Soft. Bowel sounds are normal. She exhibits no distension. There is no tenderness.  Musculoskeletal: Normal range of motion.  Neurological: She is alert and oriented to person, place, and time.  Skin: Skin is warm and dry.  Psychiatric: She has a normal mood and affect. Her behavior is normal.    Prenatal labs: ABO, Rh: B/Positive/-- (05/01 0000) Antibody: Negative (05/01 0000) Rubella: Immune (05/01 0000) RPR: NON REACTIVE (11/06 0035)  HBsAg: Negative (05/01 0000)  HIV: Non-reactive (05/01 0000)  GBS:   neg  Hgb 13.0/Ur Cx  neg/GC neg/ Chl neg/ AFP WNL/ glucose 152/ 3hr GTT one value elevated/ repeat 3 hr GTT at 32 weeks - GDM/Plt 149K  Dated by [facility] 08/14/13, cwd nl anat, post plac, female  Assessment/Plan: 22yo G1 at 36+ with ROM admitted for IOL Pitocin to induce IV pain meds/epidural for pain Pt desires to labor "naturally" Expect  SVD   BOVARD,Marisol Giambra 07/19/2013, 4:50 AM

## 2013-07-20 LAB — CBC
HCT: 25.2 % — ABNORMAL LOW (ref 36.0–46.0)
Hemoglobin: 8.6 g/dL — ABNORMAL LOW (ref 12.0–15.0)
MCHC: 34.1 g/dL (ref 30.0–36.0)
Platelets: 81 10*3/uL — ABNORMAL LOW (ref 150–400)
RDW: 13 % (ref 11.5–15.5)
WBC: 6.5 10*3/uL (ref 4.0–10.5)

## 2013-07-20 NOTE — Progress Notes (Signed)
Post Partum Day 1 Subjective: no complaints, up ad lib, tolerating PO and nl lochia, pain controlled  Objective: Blood pressure 97/61, pulse 85, temperature 98.3 F (36.8 C), temperature source Oral, resp. rate 16, height 5\' 5"  (1.651 m), weight 69.4 kg (153 lb), last menstrual period 10/30/2012, SpO2 98.00%, unknown if currently breastfeeding.  Physical Exam:  General: alert and no distress Lochia: appropriate Uterine Fundus: firm  Recent Labs  07/19/13 0035 07/20/13 0615  HGB 11.4* 8.6*  HCT 33.1* 25.2*    Assessment/Plan: Plan for discharge tomorrow, Breastfeeding and Lactation consult.  Routine PP care,     LOS: 2 days   BOVARD,Mikylah Ackroyd 07/20/2013, 7:41 AM

## 2013-07-21 MED ORDER — IBUPROFEN 600 MG PO TABS: 600.0000 mg | ORAL_TABLET | Freq: Four times a day (QID) | ORAL | Status: DC | PRN

## 2013-07-21 MED ORDER — OXYCODONE-ACETAMINOPHEN 5-325 MG PO TABS: 1.0000 | ORAL_TABLET | Freq: Four times a day (QID) | ORAL | Status: DC | PRN

## 2013-07-21 NOTE — Lactation Note (Signed)
This note was copied from the chart of Priscilla Ryan. Lactation Consultation Note: Mom reports that her nipples are getting better- still pink and raw on tips. Using comfort gels with good relief.  Mom reports that baby last fed about 2 1/2 hours ago. Baby sleepy but did wake and nurse with some stimulation needed. Mom doing well with positioning baby in football hold. OP appointment made for Thursday at 9 am. Has UMR- will get pump on Monday- reviewed hand pump with mom. Encouraged to pump before she goes home and to try to pump 3-4 times/day to promote milk supply. No questions at present. To call prn.   Patient Name: Priscilla Kailly Richoux [id number] Date: 07/21/2013 Reason for consult: Follow-up assessment   Maternal Data    Feeding Feeding Type: Breast Fed  LATCH Score/Interventions Latch: Grasps breast easily, tongue down, lips flanged, rhythmical sucking. Intervention(s): Skin to skin  Audible Swallowing: A few with stimulation Intervention(s): Skin to skin;Hand expression  Type of Nipple: Everted at rest and after stimulation  Comfort (Breast/Nipple): Filling, red/small blisters or bruises, mild/mod discomfort  Problem noted: Mild/Moderate discomfort  Hold (Positioning): Assistance needed to correctly position infant at breast and maintain latch. Intervention(s): Breastfeeding basics reviewed;Support Pillows  LATCH Score: 7  Lactation Tools Discussed/Used     Consult Status Consult Status: Follow-up Date: 07/26/13 Follow-up type: Out-patient    Pamelia Hoit 07/21/2013, 10:45 AM

## 2013-07-21 NOTE — Progress Notes (Signed)
Patient ID: Priscilla Ryan, female   DOB: 01-Dec-1990, 22 y.o.   MRN: [id number] #2 afebrile BP normal for d/c

## 2013-07-22 NOTE — Discharge Summary (Signed)
Priscilla Ryan, Priscilla Ryan              ACCOUNT NO.:  [id number]  MEDICAL RECORD NO.:  [id number]  LOCATION:  9120                          FACILITY:  [facility]  PHYSICIAN:  Malachi Pro. Ambrose Mantle, M.D. DATE OF BIRTH:  Apr 28, 1991  DATE OF ADMISSION:  07/18/2013 DATE OF DISCHARGE:  07/21/2013                              DISCHARGE SUMMARY   This is a 22 year old white female, para 1-0-0-1, gravida 2 at 36+ weeks' gestation, admitted with ruptured membranes.  The fluid was clear.  She had had several gushes.  Pregnancy was complicated by gestational diabetes mellitus, diet controlled.  Group B strep was negative.  Had a relatively benign prenatal course.  Cervix was 1 cm in the office and was the same on presentation to maternity admission unit. Blood group and type B positive, negative antibody, rubella immune, RPR nonreactive, hepatitis B surface antigen negative, HIV negative, group B strep negative.  One-hour Glucola 152, 3-hour GTT 1 value was elevated, it was repeated at 32 weeks, and showed gestational diabetes.  The patient was admitted to the hospital, was placed on Pitocin.  She progressed in labor and delivered at 6:47 a.m. on July 19, 2013, a living female infant.  The baby stayed with the mother.  The patient was seen by social work because of the history of depression and anxiety. No need for further work was necessary.  Postpartum, the patient did quite well.  Her hemoglobin initially was 11.4, hematocrit 33.1, white count 7000, followup hemoglobin 8.6, hematocrit 25.2, white count is 6500, platelet count 105 and 81.  RPR was nonreactive.  On the second postpartum day, the patient was afebrile.  Her blood pressure was normal and she was ready for discharge.  Two capillary blood glucoses on this hospitalization were 104 and 108.  FINAL DIAGNOSES:  Intrauterine pregnancy, 36 plus weeks, delivered OA. Gestational diabetes.  OPERATIONS:  Spontaneous delivery OA, repair of first-degree  laceration.  FINAL CONDITION:  Improved.  INSTRUCTIONS:  Include our regular discharge instruction booklet or after visit summary.  Prescriptions for Motrin 600 mg and Percocet 5/325, 20 tablets of each 1 every 6 hours as needed for pain.  The patient is advised to return to the office in 6 weeks for followup examination.  She is also advised to take ferrous sulfate 325 mg twice daily.     Malachi Pro. Ambrose Mantle, M.D.     TFH/MEDQ  D:  07/21/2013  T:  07/22/2013  Job:  [id number]

## 2013-07-26 ENCOUNTER — Ambulatory Visit (HOSPITAL_COMMUNITY)
Admit: 2013-07-26 | Discharge: 2013-07-26 | Disposition: A | Discharge status: Home / Self Care | Payer: 59 | Attending: Obstetrics and Gynecology | Admitting: Obstetrics and Gynecology

## 2013-07-26 NOTE — Lactation Note (Signed)
Adult Lactation Consultation Outpatient Visit Note  Patient Name: Priscilla Ryan Date of Birth: 1991/05/08 Gestational Age at Delivery: [redacted]w[redacted]d Type of Delivery:   Breastfeeding History: Frequency of Breastfeeding: at least every 3 hours (8 times per day) with one 4-5 hr span at night when baby is sleeping Length of Feeding: 10-15 min to 1 hr Voids: 8 Stools: 8 (yellow seedy)  Supplementing / Method: Pumping:  Type of Pump: Medela DEBP   Frequency: twice per day  Volume:  4.5 oz with each pumping  Comments:  36.2 week infant at birth with birth weight of 5lbs, 11 oz.  Discharge weight was 5 lbs, 6 oz (5% weight loss).  At [facility] office visit on Monday (3 days ago) infant weighed 5 lbs, 5 oz.  Today pre-feed weight 5 lbs, 4.7 oz (2400 grams) (no weight change since Monday).  Mom c/o slight tenderness on left breast and stated she did not think the baby was on deep enough.  Mom reports nipple is "misshapen" at the end of the feeding.  Consultation Evaluation:  Initial Feeding Assessment: Pre-feed Weight: 2400 g (5 lbs, 4.7 oz) Post-feed Weight: 2436 g (5 lbs, 5.9 oz) Amount Transferred: 36 ml Comments:  Mom allowed infant to self latch without mom holding breast to assist with latching.  Infant latched with a shallow latch on nipple tip.  Worked with mom on attaining a deeper latch using a cross-cradle hold with a sandwiching of breast.  Mom was able to return demonstration of latching method taught and reported no pain with feeding and stated the feeding felt better than previous feedings.  Frequent audible swallows heard.  Infant fed for 10 minutes and then went to sleep.  Could not arouse infant to re-latch or feed on second breast after weighing.     Total Breast milk Transferred this Visit: 36 ml (appropriate amount for a 1 week infant and appropriate amount based on 100 ml/kg/day calculation) Total Supplement Given: none  Additional Interventions: Due to LPTI status, sleepy  behavior after 10 minutes of feeding at breast and no weight gain in 3 days the following recommendations made:  Instructed patient to: 1.  Feed with feeding cues and waking infant as needed to feed at least 8-12 times per day 2. Follow up within the next week for a weight check at the pediatrician's office or with the lactation department 3.  Supplement with breastmilk 2-3 times per day with a curved tip syringe at breast or by spoon feeding with 15-30 ml of EBM.  Explained and demonstrated use of curved-tip syringe.  Parents inquired about bottle feeding as an option if they do not like using curved-tip syringe at breast or spoon feeding; explained paced feeding method; slow-flow nipples given and discussed the difference between bottle feeding and breastfeeding encouraging bottle feeding to be given by dad if they choose this method. 4.  Keep up with feeding diary given 5.  Keep pumping twice per day and using some EBM for supplementation while freezing the rest  Follow-Up [facility] Outpatient Appointment - Next Wed, Nov 19 @ 10:30am Smart Start Today at 4p Peds at 1 month     Lendon Ka 07/26/2013, 9:29 AM

## 2013-08-01 ENCOUNTER — Ambulatory Visit (HOSPITAL_COMMUNITY)
Admission: RE | Admit: 2013-08-01 | Discharge: 2013-08-01 | Disposition: A | Discharge status: Home / Self Care | Payer: 59 | Source: Ambulatory Visit | Attending: Obstetrics and Gynecology | Admitting: Obstetrics and Gynecology

## 2013-08-01 NOTE — Lactation Note (Addendum)
Infant Lactation Consultation Outpatient Visit Note  Patient Name: Priscilla Ryan Date of Birth: 1991-02-25    Gaynelle Adu Birth weight - 5-11oz                                                                     D/C weight - 5-6 oz ( 5% weight loss )                                                                     11/13 LC apt - 5-4.7 oz                                                                     11/18-Smart start- 5-12 oz ( per mom with a diaper )                                                                      11/18- Pedis - 5-10 oz ( per mom no diaper )                                                                      11/19 - LC O/P - 5-11.5 oz ( 2596g ) Today's weight:   Gestational Age at Delivery: Gestational Age: <None> Type of Delivery: Vaginal delivery on 07/19/2013 Reason for visit - F/U from last weeks LC apt 11/13 , for weight check and feeding assessment - per mom breast  feeding is going well and my baby seems happy. Breastfeeding History- per mom - breast feeding is going well both breast , and soreness is gone.  Frequency of Breastfeeding: was every 3 -3 1/2 hours and has recently changed to every 2-2 1/2 hours ,  longer feeding in the middle of the night, sometimes and hour and then sleeps 4 hours. Length of Feeding: 1st breast 15 -35 mins , per mom usually feeding one one breast  Voids: >6  Stools: > 6 , yellowish seedy   Supplementing / Method: non supplementing needed per mom just feeding at the breast  Pumping:  Type of Pump: DEBP Medela    Frequency: X1 per 24 hours   Volume:  4 oz after feeding   Comments: per mom #24 flange fits well , no discomfort per mom  Consultation Evaluation: Both breast full , no plugs or engorgement noted, nipples pinky ( per mom my normal color) , denies soreness   Initial Feeding Assessment: Per mom last feeding was at 0900 for 10 mins  Pre-feed Weight: 5-11.5 oz 2596 g  Post-feed Weight: 5-13.2 oz 2640 g   Amount Transferred: 44 ml  Comments: left breast - cross cradle -  Mom independent with latch , except the upper lip needed to be flipped open. Depth obtained and fed 10 mins , released for a short break and relatched after a burp and fed 14 mins and released ,  nipple appeared normal when baby released.   Additional Feeding Assessment: Pre-feed Weight: 5-13.2 oz 2640g  Post-feed Weight: 5-13.5 oz 2650g  Amount Transferred:10 ml  Comments: right breast - football , mom independent with latch , except flipping upper lip open and easing chin down . Multiply swallows , increased with breast compressions.  Baby fed for 8-10 mins and released , small yellow stool and wet changed a re-weight.    Additional Feeding Assessment: Pre-feed Weight: 5-13.1 2646g  Post-feed Weight:5-13.6 2646 g  Amount Transferred: 8 ml  Comments: re-latched in football right breast , with depth , and mom independent with latch and flipping open the lips. Per mom comfortable. Multiply swallows noted , increased with breast compressions.   Total Breast milk Transferred this Visit: 62 ml from the breast  Total Supplement Given: none   Lactation Plan of Care - Praised mom for her breast feeding efforts                                 Mom - encouraged plenty fluids , esp H20 , nutritious snacks and meals                                             Naps , rest , especially during the day until [name] gets on a better sleeping schedule                                           - Feedings - with feeding cues , by 3 hours - 3 1/2 if [name] isn't awake showing feeding cues , place skin to skin                                           - Skin to skin for feedings until [name] can stay awake for a feeding                   - Protect Established milk supply - If [name] only feeds one Breast wait for awhile , if she doesn't - hand express or pump both                    breast for 5-7 mins to release fullness, ( protect milk  supply) .                  - After 2 feedings and when necessary post pump for 10 mins and save milk due to baby being  late preterm .                 - Discussion - Having flexibility with breast feeding due to questionable returning back to work - @4 -6 weeks introduce bottle , medium base about 3 oz and pump instead for that feeding .                                           Then continue to give one bottle a day ( dad or family ) and pump.  Comments - LC feels mom is doing and excellent job breast feeding and is able to latch baby with depth . LC felt mom could graduate to the BFSG and just have weekly weight check until the 1 month check up.    Follow-Up- Per mom F/U with Dr. Ky Barban 12/18 for 1 month apt .                   - LC BFSG every Tuesday at 11am , for weight checks ore 2nd or 4th Monday evening at 7pm       Kathrin Greathouse 08/01/2013, 10:46 AM

## 2013-09-13 LAB — HM PAP SMEAR: HM Pap smear: NORMAL

## 2013-12-27 ENCOUNTER — Ambulatory Visit: Payer: 59 | Admitting: Nurse Practitioner

## 2014-01-03 ENCOUNTER — Encounter: Payer: Self-pay | Admitting: Nurse Practitioner

## 2014-01-03 ENCOUNTER — Ambulatory Visit (INDEPENDENT_AMBULATORY_CARE_PROVIDER_SITE_OTHER): Payer: 59 | Admitting: Nurse Practitioner

## 2014-01-03 VITALS — BP 100/64 | HR 80 | Temp 98.4°F | Ht 64.0 in | Wt 130.0 lb

## 2014-01-03 DIAGNOSIS — Z01419 Encounter for gynecological examination (general) (routine) without abnormal findings: Secondary | ICD-10-CM

## 2014-01-03 DIAGNOSIS — Z975 Presence of (intrauterine) contraceptive device: Secondary | ICD-10-CM

## 2014-01-03 MED ORDER — FLUCONAZOLE 150 MG PO TABS: 150.0000 mg | ORAL_TABLET | Freq: Once | ORAL | Status: DC

## 2014-01-03 NOTE — Progress Notes (Signed)
Patient ID: Priscilla Ryan, female   DOB: 09/23/1990, 23 y.o.   MRN: [id number] 23 y.o. [id number] Married Caucasian Fe here for annual exam. She delivered healthy baby girl on 07/19/13.  She has been breast feeding until 3 months ago.  She had postpartum check with OB in January and had pap done.  Menses normal for 3 months.  Not on any birth control except condoms.  Wants to get Mirena IUD. She is also concerned that everything feels different in the vaginal area - more relaxation.  She is doing Kegel exercises.  Patient's last menstrual period was 12/29/2013.          Sexually active: yes  The current method of family planning is none.    Exercising: yes  Home exercise routine includes cardio workout three times per week. Smoker:  no  Health Maintenance: [facility] TDaP:  2007 Gardasil:  completed in 2008 Labs:    Declined    reports that she quit smoking about 17 months ago. She has never used smokeless tobacco. She reports that she does not drink alcohol.  Past Medical History  Diagnosis Date  . Depression age 23    took Lexapro & had reaction with extreme fatigue no meds for 6 yrs.  . Gestational diabetes     diet controlled  . ROM (rupture of membranes), premature 07/19/2013    History reviewed. No pertinent past surgical history.  Current Outpatient Prescriptions  Medication Sig Dispense Refill  . fluconazole (DIFLUCAN) 150 MG tablet Take 1 tablet (150 mg total) by mouth once. Take one tablet.  Repeat in 48 hours if symptoms are not completely resolved.  2 tablet  0   No current facility-administered medications for this visit.    Family History  Problem Relation Age of Onset  . Heart failure Maternal Grandfather     heart stents    ROS:  Pertinent items are noted in HPI.  Otherwise, a comprehensive ROS was negative.  Exam:   BP 100/64  Pulse 80  Temp(Src) 98.4 F (36.9 C) (Oral)  Ht 5\' 4"  (1.626 m)  Wt 130 lb (58.968 kg)  BMI 22.30 kg/m2  LMP  12/29/2013  Breastfeeding? No Height: 5\' 4"  (162.6 cm)  Ht Readings from Last 3 Encounters:  01/03/14 5\' 4"  (1.626 m)  07/19/13 5\' 5"  (1.651 m)  06/28/13 5\' 5"  (1.651 m)    General appearance: alert, cooperative and appears stated age Head: Normocephalic, without obvious abnormality, atraumatic Neck: no adenopathy, supple, symmetrical, trachea midline and thyroid normal to inspection and palpation Lungs: clear to auscultation bilaterally Breasts: normal appearance, no masses or tenderness Heart: regular rate and rhythm Abdomen: soft, non-tender; no masses,  no organomegaly Extremities: extremities normal, atraumatic, no cyanosis or edema Skin: Skin color, texture, turgor normal. No rashes or lesions Lymph nodes: Cervical, supraclavicular, and axillary nodes normal. No abnormal inguinal nodes palpated Neurologic: Grossly normal   Pelvic: External genitalia:  no lesions, irritation that could be yeast along the vulva on the left.              Urethra:  normal appearing urethra with no masses, tenderness or lesions              Bartholin's and Skene's: normal                 Vagina: normal appearing vagina with normal color and no discharge, no lesions  Cervix: anteverted              Pap taken: no Bimanual Exam:  Uterus:  normal size, contour, position, consistency, mobility, non-tender              Adnexa: no mass, fullness, tenderness               Rectovaginal: Confirms               Anus:  normal sphincter tone, no lesions  A:  Well Woman with normal exam  Desires Mirena IUD for contraception  Yeast vulva vaginitis  P:   Reviewed health and wellness pertinent to exam  Pap smear not taken today  Diflucan 150 mg X 2 doses  Information given about Mirena IUD and order is placed.  She will be going to the beach about the time of her next menses - so may need to get the following month.  Counseled on breast self exam, adequate intake of calcium and vitamin D, diet  and exercise, Kegel's exercises return annually or prn  An After Visit Summary was printed and given to the patient.

## 2014-01-03 NOTE — Patient Instructions (Signed)

## 2014-01-06 NOTE — Progress Notes (Signed)
Encounter reviewed by Dr. Mykael Batz Silva.  

## 2014-01-09 ENCOUNTER — Telehealth: Payer: Self-pay | Admitting: Obstetrics and Gynecology

## 2014-01-09 NOTE — Telephone Encounter (Signed)
Spoke with patient. Advised that per benefits quote received, IUD and insertion is covered at 100%. There will be 0 patient liability. Patient is to call within the first 5 days of her cycle to schedule insertion. °

## 2014-02-27 ENCOUNTER — Telehealth: Payer: Self-pay | Admitting: Nurse Practitioner

## 2014-02-27 NOTE — Telephone Encounter (Signed)
Message left to return call to Priscilla Ryan at [phone].    

## 2014-02-27 NOTE — Telephone Encounter (Signed)
Attempted call again prior to end of day closing. No answer. Did not leave another message.

## 2014-02-27 NOTE — Telephone Encounter (Signed)
Pt started cycle today and would like to schedule for iud insertion.

## 2014-02-28 NOTE — Telephone Encounter (Signed)
Spoke with patient. Offered appointment for Monday at 1:30pm with Dr.Miller. Patient states "I think that I will be off my cycle by then. It usually only lasts three to four days. Could I come in before Monday?" Patient requesting tomorrow before 1300.Advised patient would have to check scheduling and give patient a call back with further availability and appointment times. Patient agreeable.

## 2014-02-28 NOTE — Telephone Encounter (Signed)
Attempted to reach patient at number provided again to try to get her appointment today at 4:00pm with Dr.Miller per Kennon RoundsSally. Left message to call Kaitlyn at [phone].

## 2014-02-28 NOTE — Telephone Encounter (Signed)
Left message to call Kaitlyn at [phone].   Offer patient today at 4:00pm with Dr.Miller (time per Kennon RoundsSally).

## 2014-02-28 NOTE — Telephone Encounter (Signed)
Spoke with patient. Offered 4pm appointment with Dr.Miller today but patient declines. Patient would like to wait until next cycle to get IUD inserted. Will call back with first day of menses to scheduled IUD insertion for next month.  Routing to Dr.Miller as covering and inserting IUD CC: Lauro FranklinPatricia Rolen-Grubb, FNP   Routing to provider for final review. Patient agreeable to disposition. Will close encounter

## 2014-04-01 ENCOUNTER — Telehealth: Payer: Self-pay | Admitting: Nurse Practitioner

## 2014-04-01 NOTE — Telephone Encounter (Signed)
Spoke with patient. Patient started cycle last Friday evening 7/17. Would like to get IUD inserted. Advised Dr.Miller is out of the office this week but could have patient have IUD insertion with Dr.Lathrop. Patient agreeable. Appointment scheduled for tomorrow at 2pm with Dr.Lathrop (time per Kennon RoundsSally). Patient agreeable to date and time. Pre procedure instructions given.  Motrin instructions given. Motrin=Advil=Ibuprofen, 800 mg one hour before appointment. Eat a meal and hydrate well before appointment. Patient agreeable.   Dr.Lathrop, this a patient of Priscilla Franklinatricia Rolen-Grubb, FNP who was seen and counseled with Patty for Mirena insertion. Patient has one child. Dr.Miller approved IUD insertion without another counseling for this patient. Are you okay to keep patient as scheduled for tomorrow as Dr.Miller is out this week?

## 2014-04-01 NOTE — Telephone Encounter (Signed)
Patient is ready to schedule IUD insertion appointment.

## 2014-04-01 NOTE — Telephone Encounter (Signed)
Yes, fine

## 2014-04-02 ENCOUNTER — Encounter: Payer: Self-pay | Admitting: Gynecology

## 2014-04-02 ENCOUNTER — Ambulatory Visit (INDEPENDENT_AMBULATORY_CARE_PROVIDER_SITE_OTHER): Payer: 59 | Admitting: Gynecology

## 2014-04-02 VITALS — BP 96/60 | HR 80 | Resp 18 | Ht 64.0 in | Wt 124.0 lb

## 2014-04-02 DIAGNOSIS — Z30430 Encounter for insertion of intrauterine contraceptive device: Secondary | ICD-10-CM

## 2014-04-02 DIAGNOSIS — Z975 Presence of (intrauterine) contraceptive device: Secondary | ICD-10-CM | POA: Insufficient documentation

## 2014-04-02 HISTORY — DX: Encounter for insertion of intrauterine contraceptive device: Z30.430

## 2014-04-02 NOTE — Progress Notes (Signed)
[age] yrs Married Caucasian female presents for  insertion of Mirena. Denies any vaginal symptoms or STD concerns.  LMP: 7/17/205  Patient read information regarding IUD insertion.  All questions addressed.    Healthy female,time, place and personnormal menses, no abnormal bleeding, pelvic pain or discharge, no breast pain or new or enlarging lumps on self exam.  Pt stopped breastfeeding 3113m ago.  Cycles returned mid February.  Pt is not using contraception, interested in Mirena IUD. Abdomen: soft, non-tender Groinno inguinal nodes palpated  Pelvic exam: Vulva;normal female genitalia  Vagina:normal vagina, no discharge, exudate, lesion, or erythema  Cervix:Non-tender, Negative CMT, no lesions or redness, nulliparous/parous os  Uterus:normal shape, position and consistency     Procedure:  Speculum inserted into vagina. Cervix visualized and cleansed with betadine solution X 3. Xylocaine jelly placed anterior lip and endocervix. Tenaculum placed on cervix at 12 o'clock position(s).  Uterus sounded to 8 centimeters.  IUD removed from sterile packet and under sterile conditions inserted to fundus of uterus.  Introducer removed without difficulty.  IUD string trimmed to 3 centimeters.  Remainder string given to patient to feel for identification.  Tenaculum removed.  No bleeding noted.  Speculum removed.  Uterus palpated normal.  Patient tolerated procedure well.  A: Insertion of Lot # [id number], Expiration date 8/17, MIRENA   P:  Instructions and warnings signs given.       IUD identification card given with IUD removal 03/2019       Return visit 3777m

## 2014-04-30 ENCOUNTER — Ambulatory Visit: Payer: 59 | Admitting: Gynecology

## 2014-05-13 ENCOUNTER — Ambulatory Visit (INDEPENDENT_AMBULATORY_CARE_PROVIDER_SITE_OTHER): Payer: 59 | Admitting: Gynecology

## 2014-05-13 ENCOUNTER — Encounter: Payer: Self-pay | Admitting: Gynecology

## 2014-05-13 VITALS — BP 110/70 | HR 74 | Resp 18 | Ht 64.0 in | Wt 126.0 lb

## 2014-05-13 DIAGNOSIS — Z30431 Encounter for routine checking of intrauterine contraceptive device: Secondary | ICD-10-CM

## 2014-05-13 NOTE — Progress Notes (Signed)
Subjective:     Patient ID: Priscilla Ryan, female   DOB: 09-Sep-1991, 23 y.o.   MRN: [id number]  HPI Comments: Pt here for IUD check, Mirena placed 04/02/14, still bleeding, light, brown.  Pt denies dyspareunia, no cramping.    Review of Systems     Objective:   Physical Exam  Nursing note and vitals reviewed. Constitutional: She is oriented to person, place, and time. She appears well-developed and well-nourished.  Genitourinary:   Pelvic: External genitalia:  no lesions              Urethra:  normal appearing urethra with no masses, tenderness or lesions              Bartholins and Skenes: normal                 Vagina: normal appearing vagina with normal color and discharge, no lesions              Cervix: normal appearance, strings at os                      Bimanual Exam:  Uterus: normal size, shape, consistency and nontender                                       Adnexa: normal adnexa in size, nontender and no masses                                        Neurological: She is alert and oriented to person, place, and time.       Assessment:     IUD check     Plan:     Doing well Bleeding profile reviewed F/u prn

## 2014-07-15 ENCOUNTER — Encounter: Payer: Self-pay | Admitting: Gynecology

## 2016-08-13 ENCOUNTER — Telehealth: Payer: Self-pay | Admitting: Nurse Practitioner

## 2016-08-13 NOTE — Telephone Encounter (Signed)
Spoke with patient. Patient states she had Mirena IUD placed 04/02/14. Patient reports she has had no spotting or bleeding while IUD in place other than after initial placement. Patient states spotting started yesterday and really light. Patient states since she never had any bleeding she thought she should call.  Patient denies any pain or discomfort, urinary complaints or vaginal discharge. Advised patient will review with Ria CommentPatricia Grubb, NP and return call with recommendations. Patient is agreeable.   Ria CommentPatricia Grubb, NP -please advise?

## 2016-08-13 NOTE — Telephone Encounter (Signed)
Spoke with patient. Advised patient per Ria CommentPatricia Grubb, NP -last AEX 01/03/14, schedule AEX and will evaluate bleeding at that visit. Patient scheduled for 08/17/16 at 10:30am with Ria CommentPatricia Grubb, NP. Patient verbalizes understanding and is agreeable.  Routing to provider for final review. Patient is agreeable to disposition. Will close encounter.

## 2016-08-13 NOTE — Telephone Encounter (Signed)
Patient has IUD and has had it for about 3 yrs.  She has began spotting yesterday.  Said she has never had this issue before and wants to speak with someone about it.

## 2016-08-13 NOTE — Telephone Encounter (Signed)
She needs evaluation for AUB - needs AEX

## 2016-08-17 ENCOUNTER — Encounter: Payer: Self-pay | Admitting: Nurse Practitioner

## 2016-08-17 ENCOUNTER — Encounter: Payer: Self-pay | Admitting: Obstetrics and Gynecology

## 2016-08-17 ENCOUNTER — Other Ambulatory Visit: Payer: Self-pay | Admitting: *Deleted

## 2016-08-17 ENCOUNTER — Ambulatory Visit (INDEPENDENT_AMBULATORY_CARE_PROVIDER_SITE_OTHER): Payer: BLUE CROSS/BLUE SHIELD | Admitting: Nurse Practitioner

## 2016-08-17 ENCOUNTER — Ambulatory Visit (INDEPENDENT_AMBULATORY_CARE_PROVIDER_SITE_OTHER): Payer: BLUE CROSS/BLUE SHIELD

## 2016-08-17 ENCOUNTER — Ambulatory Visit (INDEPENDENT_AMBULATORY_CARE_PROVIDER_SITE_OTHER): Payer: BLUE CROSS/BLUE SHIELD | Admitting: Obstetrics and Gynecology

## 2016-08-17 VITALS — BP 114/66 | HR 68 | Ht 64.0 in | Wt 162.0 lb

## 2016-08-17 DIAGNOSIS — N926 Irregular menstruation, unspecified: Secondary | ICD-10-CM

## 2016-08-17 DIAGNOSIS — T8332XA Displacement of intrauterine contraceptive device, initial encounter: Secondary | ICD-10-CM

## 2016-08-17 DIAGNOSIS — Z30431 Encounter for routine checking of intrauterine contraceptive device: Secondary | ICD-10-CM | POA: Diagnosis not present

## 2016-08-17 DIAGNOSIS — N898 Other specified noninflammatory disorders of vagina: Secondary | ICD-10-CM

## 2016-08-17 DIAGNOSIS — N840 Polyp of corpus uteri: Secondary | ICD-10-CM | POA: Diagnosis not present

## 2016-08-17 DIAGNOSIS — Z0000 Encounter for general adult medical examination without abnormal findings: Secondary | ICD-10-CM | POA: Diagnosis not present

## 2016-08-17 DIAGNOSIS — Z01411 Encounter for gynecological examination (general) (routine) with abnormal findings: Secondary | ICD-10-CM | POA: Diagnosis not present

## 2016-08-17 LAB — POCT URINE PREGNANCY: Preg Test, Ur: NEGATIVE

## 2016-08-17 NOTE — Progress Notes (Signed)
GYNECOLOGY  VISIT   HPI: 25 y.o.   Married  Caucasian  female   G1P0101 with Patient's last menstrual period was 08/12/2016 (exact date).   here for IUD placement check/ Pelvic U/S. The patient had a mirena placed in 7/15. No bleeding until last week, then 2 days of light bleeding. No pain. She had an exam earlier today, IUD string not seen.   GYNECOLOGIC HISTORY: Patient's last menstrual period was 08/12/2016 (exact date). Contraception:IUD (Mirena)  Menopausal hormone therapy: none         OB History    Gravida Para Term Preterm AB Living   1 1 0 1 0 1   SAB TAB Ectopic Multiple Live Births   0 0 0 0 1         Patient Active Problem List   Diagnosis Date Noted  . IUD (intrauterine device) in place 04/02/2014    Past Medical History:  Diagnosis Date  . Depression age 25   took Lexapro & had reaction with extreme fatigue no meds for 6 yrs.  . Encounter for insertion of mirena IUD 04/02/14  . Gestational diabetes    diet controlled  . ROM (rupture of membranes), premature 07/19/2013    No past surgical history on file.  Current Outpatient Prescriptions  Medication Sig Dispense Refill  . levonorgestrel (MIRENA) 20 MCG/24HR IUD 1 each by Intrauterine route once.     No current facility-administered medications for this visit.      ALLERGIES: Patient has no known allergies.  Family History  Problem Relation Age of Onset  . Heart failure Maternal Grandfather     heart stents    Social History   Social History  . Marital status: Married    Spouse name: N/A  . Number of children: N/A  . Years of education: N/A   Occupational History  . Not on file.   Social History Main Topics  . Smoking status: Former Smoker    Quit date: 07/14/2012  . Smokeless tobacco: Never Used  . Alcohol use 1.8 oz/week    3 Standard drinks or equivalent per week  . Drug use: No  . Sexual activity: Yes    Partners: Male    Birth control/ protection: IUD     Comment: Mirena  inserted 04/02/14   Other Topics Concern  . Not on file   Social History Narrative  . No narrative on file    Review of Systems  Constitutional: Negative.   HENT: Negative.   Eyes: Negative.   Respiratory: Negative.   Cardiovascular: Negative.   Gastrointestinal: Negative.   Genitourinary:       Irregular menstrual bleeding   Musculoskeletal: Negative.   Skin: Negative.   Neurological: Negative.   Endo/Heme/Allergies: Negative.   Psychiatric/Behavioral: Negative.     PHYSICAL EXAMINATION:    LMP 08/12/2016 (Exact Date)     General appearance: alert, cooperative and appears stated age  Reviewed ultrasound images with the patient. There is a question of a endometrial polyp, just under one cm, feeder vessel seen. IUD in place.  ASSESSMENT IUD string not seen, IUD in place on ultrasound 1 episode of bleeding in 2 years, discussed that this can be normal with the IUD Possible endometrial polyp. Wouldn't expect a polyp in this patient who has had an IUD for 2 years.   PLAN UPT negative Reassured IUD in place Wouldn't recommend further evaluation or treatment of a possible endometrial polyp at this point She will calendar any bleeding and  call with concerns Would recommend a f/u ultrasound in 1 year, sooner with concerns.   An After Visit Summary was printed and given to the patient.  10 minutes face to face time of which over 50% was spent in counseling.              CC: Shirlyn GoltzPatty Grubb, NP

## 2016-08-17 NOTE — Progress Notes (Signed)
Patient ID: Priscilla Ryan, female   DOB: [birth year], 25 y.o.   MRN: [id number]  25 y.o. G19P0101 Married  Caucasian Fe here for annual exam.  Daughter is now 33 yrs old.  Last Thursday had a 2 day cycle that has been the first one since IUD placement in 2015.  Some recent changes in weight and now on a low carb diet and avoiding meat.  The bleeding was darker brown with clots and stringy. She is concerned about IUD migration.  Denies any pain.  Not planning another pregnancy at this time.  Patient's last menstrual period was 08/12/2016 (exact date).          Sexually active: Yes.    The current method of family planning is IUD. Mirena place 04/02/14. Exercising: Yes.    hiking and running Smoker:  no  Health Maintenance: Pap:  09/2013 at OB, normal TDaP: 09/13/12 Gardasil:  completed in 2008 HIV: 01/11/13 Labs: HB: 12.9   Urine: unable to void   reports that she quit smoking about 4 years ago. She has never used smokeless tobacco. She reports that she drinks about 1.8 oz of alcohol per week . She reports that she does not use drugs.  Past Medical History:  Diagnosis Date  . Depression age 21   took Lexapro & had reaction with extreme fatigue no meds for 6 yrs.  . Encounter for insertion of mirena IUD 04/02/14  . Gestational diabetes    diet controlled  . ROM (rupture of membranes), premature 07/19/2013    History reviewed. No pertinent surgical history.  Current Outpatient Prescriptions  Medication Sig Dispense Refill  . levonorgestrel (MIRENA) 20 MCG/24HR IUD 1 each by Intrauterine route once.     No current facility-administered medications for this visit.     Family History  Problem Relation Age of Onset  . Heart failure Maternal Grandfather     heart stents    ROS:  Pertinent items are noted in HPI.  Otherwise, a comprehensive ROS was negative.  Exam:   BP 114/66 (BP Location: Right Arm, Patient Position: Sitting, Cuff Size: Normal)   Pulse 68   Ht  (1.626 m)   Wt 162  lb (73.5 kg)   LMP 08/12/2016 (Exact Date)   BMI 27.81 kg/m  Height:  (162.6 cm) Ht Readings from Last 3 Encounters:  08/17/16  (1.626 m)  05/13/14  (1.626 m)  04/02/14  (1.626 m)    General appearance: alert, cooperative and appears stated age Head: Normocephalic, without obvious abnormality, atraumatic Neck: no adenopathy, supple, symmetrical, trachea midline and thyroid normal to inspection and palpation Lungs: clear to auscultation bilaterally Breasts: normal appearance, no masses or tenderness Heart: regular rate and rhythm Abdomen: soft, non-tender; no masses,  no organomegaly Extremities: extremities normal, atraumatic, no cyanosis or edema Skin: Skin color, texture, turgor normal. No rashes or lesions Lymph nodes: Cervical, supraclavicular, and axillary nodes normal. No abnormal inguinal nodes palpated Neurologic: Grossly normal   Pelvic: External genitalia:  no lesions              Urethra:  normal appearing urethra with no masses, tenderness or lesions              Bartholin's and Skene's: normal                 Vagina: normal appearing vagina with normal color and light brown discharge, no lesions  Cervix: anteverted  Several attempts to find IUD string and not visible              Pap taken: Yes.   Bimanual Exam:  Uterus:  normal size, contour, position, consistency, mobility, non-tender              Adnexa: no mass, fullness, tenderness               Rectovaginal: Confirms               Anus:  normal sphincter tone, no lesions  Chaperone present: yes  A:  Well Woman with normal exam  Mirena IUD for contraception 04/02/14  Unable to find IUD strings  AUB on 11/30 X 2 days  R/O vaginitis   P:   Reviewed health and wellness pertinent to exam  Pap smear was done  Will follow with [facility]  Will get PUS and follow with IUD placement  Counseled on breast self exam, adequate intake of calcium and vitamin D, diet and exercise return  annually or prn  An After Visit Summary was printed and given to the patient.

## 2016-08-17 NOTE — Patient Instructions (Signed)

## 2016-08-18 LAB — WET PREP BY MOLECULAR PROBE
Candida species: NEGATIVE
GARDNERELLA VAGINALIS: NEGATIVE
Trichomonas vaginosis: NEGATIVE

## 2016-08-19 LAB — IPS PAP TEST WITH HPV

## 2016-08-20 LAB — HEMOGLOBIN, FINGERSTICK: Hemoglobin, fingerstick: 12.6 g/dL (ref 12.0–16.0)

## 2016-08-20 NOTE — Progress Notes (Signed)
Encounter reviewed by Dr. Janean SarkBrook Amundson C. Silva.  I recommend a hemoglobin A1C due to history of gestational diabetes.

## 2016-10-04 ENCOUNTER — Telehealth: Payer: Self-pay | Admitting: *Deleted

## 2016-10-04 NOTE — Telephone Encounter (Signed)
-----   Message from Ria CommentPatricia Grubb, FNP sent at 09/08/2016  2:48 PM EST ----- Regarding: FW: I would recommend a hemoglobin A1C Continue to give her a call about HGB AIC  - she already had PUS day of AEX.  Thanks  ----- Message ----- From: Patton SallesBrook E Amundson C Silva, MD Sent: 08/20/2016   5:48 AM To: Ria CommentPatricia Grubb, FNP Subject: I would recommend a hemoglobin A1C             Hi Patty,  I just saw that this patient has a history of gestational diabetes.  She needs to have regular screening for diabetes.  How about ordering a hemoglobin A1C for when she comes in for ultrasound?  Thanks,   W. R. BerkleyBrook

## 2016-10-04 NOTE — Telephone Encounter (Signed)
Patient is notified of recommendation. She is agreeable with this plan.  Patient had ultrasound same day as annual exam, but will come in for lab appointment on 10/11/16 for hemoglobin A1C.

## 2016-10-11 ENCOUNTER — Other Ambulatory Visit (INDEPENDENT_AMBULATORY_CARE_PROVIDER_SITE_OTHER): Payer: BLUE CROSS/BLUE SHIELD

## 2016-10-11 ENCOUNTER — Other Ambulatory Visit: Payer: Self-pay | Admitting: Nurse Practitioner

## 2016-10-11 DIAGNOSIS — Z8632 Personal history of gestational diabetes: Secondary | ICD-10-CM

## 2016-10-11 LAB — HEMOGLOBIN A1C
Hgb A1c MFr Bld: 4.9 %
Mean Plasma Glucose: 94 mg/dL

## 2017-08-19 ENCOUNTER — Ambulatory Visit: Payer: BLUE CROSS/BLUE SHIELD | Admitting: Nurse Practitioner

## 2017-08-21 ENCOUNTER — Telehealth: Payer: Self-pay | Admitting: Obstetrics and Gynecology

## 2017-08-21 NOTE — Telephone Encounter (Signed)
08/21/17 spoke with pt re: dr cx aex due to inclement weather/[place]

## 2017-08-22 ENCOUNTER — Ambulatory Visit: Payer: BLUE CROSS/BLUE SHIELD | Admitting: Obstetrics and Gynecology

## 2017-08-30 ENCOUNTER — Encounter: Payer: Self-pay | Admitting: Obstetrics and Gynecology

## 2017-08-30 ENCOUNTER — Ambulatory Visit: Payer: BLUE CROSS/BLUE SHIELD | Admitting: Obstetrics and Gynecology

## 2018-02-28 ENCOUNTER — Telehealth: Payer: Self-pay | Admitting: *Deleted

## 2018-02-28 NOTE — Telephone Encounter (Signed)
Sending to Kennon RoundsSally, RN  As I can not schedule U/S -eh

## 2018-02-28 NOTE — Telephone Encounter (Signed)
-----   Message from Romualdo BolkJill Evelyn Jertson, MD sent at 02/28/2018  2:00 PM EDT ----- The patient is overdue for an annual exam and an ultrasound. Please call her and schedule for the same day. ? Endometrial polyp Thanks, Noreene LarssonJill ----- Message ----- From: SYSTEM Sent: 02/20/2018  12:05 AM To: Romualdo BolkJill Evelyn Jertson, MD

## 2018-03-02 ENCOUNTER — Telehealth: Payer: Self-pay | Admitting: *Deleted

## 2018-03-02 DIAGNOSIS — Z309 Encounter for contraceptive management, unspecified: Secondary | ICD-10-CM

## 2018-03-02 NOTE — Telephone Encounter (Signed)
Patient overdue for annual and follow-up ultrasound. Call to patient. Voicemail full and unable to leave message.

## 2018-03-06 NOTE — Progress Notes (Signed)
27 y.o. G43P0101 Married Caucasian female here for annual exam.    Patient wants Mirena removed today. Gained 35 pounds in the last 4 years.  Feels her sex drive is lower and when she orgasms, it is painful.   Wants to have a menstruation.  Has not had a cycle in about 5 years.   Cannot remember to take a pill for contraception.  Used the Depo in the past.  Used NuvaRing in the past and did well.   Considering childbearing in the next year.   IUD strings not visible on prior exam.  US done 2017 - IUD in endometrial canal and possible endometrial polyp noted.   Has a 48 year old daughter.  Works as a Transport planner.  Loosing weight due to increased walking.   PCP:  Creola Corn, [place]   No LMP recorded. (Menstrual status: IUD).     Period Cycle (Days): (Mirena IUD)     Sexually active: Yes.    The current method of family planning is IUD-Mirena 04-02-14    Exercising: Yes.    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 Smoker:  no  Health Maintenance: Pap: 08-17-16 Neg:Neg HR HPV, 09-13-13 normal per pt. History of abnormal Pap:  no MMG:  n/a Colonoscopy:  n/a BMD:   n/a  Result  n/a TDaP:  09-13-12 Gardasil:   Yes, completed 2008 HIV: neg 2014 Hep C:  NA.  Screening Labs:     reports that she quit smoking about 5 years ago. She has never used smokeless tobacco. She reports that she drinks about 1.8 oz of alcohol per week. She reports that she does not use drugs.  Past Medical History:  Diagnosis Date  . Depression age 17   took Lexapro & had reaction with extreme fatigue no meds for 6 yrs.  . Encounter for insertion of mirena IUD 04/02/14  . Gestational diabetes    diet controlled  . ROM (rupture of membranes), premature 07/19/2013    No past surgical history on file.  Current Outpatient Medications  Medication Sig Dispense Refill  . glucosamine-chondroitin 500-400 MG tablet Take 1 tablet by mouth 3 (three) times daily.    Marland Kitchen levonorgestrel (MIRENA) 20 MCG/24HR IUD 1 each by Intrauterine route  once.    . valACYclovir (VALTREX) 500 MG tablet Take 1 tablet by mouth as needed.  1   No current facility-administered medications for this visit.     Family History  Problem Relation Age of Onset  . Heart failure Maternal Grandfather        heart stents    Review of Systems  Constitutional:       Weight gain  HENT: Negative.   Eyes: Negative.   Respiratory: Negative.   Cardiovascular: Negative.   Gastrointestinal: Negative.   Endocrine: Negative.   Genitourinary: Negative.   Musculoskeletal: Negative.   Skin: Negative.   Allergic/Immunologic: Negative.   Neurological: Negative.   Hematological: Negative.   Psychiatric/Behavioral: Negative.     Exam:   BP 100/60 (BP Location: Right Arm, Patient Position: Sitting, Cuff Size: Normal)   Pulse 60   Resp 14   Ht 5\' 4"  (1.626 m)   Wt 167 lb 12.8 oz (76.1 kg)   BMI 28.80 kg/m     General appearance: alert, cooperative and appears stated age Head: Normocephalic, without obvious abnormality, atraumatic Neck: no adenopathy, supple, symmetrical, trachea midline and thyroid normal to inspection and palpation Lungs: clear to auscultation bilaterally Breasts: normal appearance, no masses or tenderness, No nipple  retraction or dimpling, No nipple discharge or bleeding, No axillary or supraclavicular adenopathy Heart: regular rate and rhythm Abdomen: soft, non-tender; no masses, no organomegaly Extremities: extremities normal, atraumatic, no cyanosis or edema Skin: Skin color, texture, turgor normal. No rashes or lesions Lymph nodes: Cervical, supraclavicular, and axillary nodes normal. No abnormal inguinal nodes palpated Neurologic: Grossly normal  Pelvic: External genitalia:  no lesions              Urethra:  normal appearing urethra with no masses, tenderness or lesions              Bartholins and Skenes: normal                 Vagina: normal appearing vagina with normal color and discharge, no lesions              Cervix:  no lesions.  IUD strings noted.               Pap taken: No. Bimanual Exam:  Uterus:  normal size, contour, position, consistency, mobility, non-tender             IUD removal.  Verbal permission to remove IUD.  Ring forceps used to remove IUD, intact, shown to patient and discarded.   Chaperone was present for exam.  Assessment:   Well woman visit with normal exam. Mirena IUD.  Removed.  Hx GDM.   Plan: Mammogram screening. Recommended self breast awareness. Pap and HR HPV as above. Guidelines for Calcium, Vitamin D, regular exercise program including cardiovascular and weight bearing exercise. NuvaRing for one year.  Already knows how to use this.  She understands this can lower her libido.  Routine labs including HgbA1C. Follow up annually and prn.   After visit summary provided.

## 2018-03-06 NOTE — Telephone Encounter (Signed)
Call to patient. Discussed need for annual and follow-up ultrasound.  Patient interested in IUD removal as well.  Discussed that she can review this at annual and possible removal same day. Then can review need for follow-up ultrasound and scheduling. Annual scheduled for 03-08-18 with Dr Edward JollySilva per patient request.   Routing to provider for review.  Will close encounter.   CC: Dr Edward JollySilva

## 2018-03-08 ENCOUNTER — Encounter: Payer: Self-pay | Admitting: Obstetrics and Gynecology

## 2018-03-08 ENCOUNTER — Ambulatory Visit (INDEPENDENT_AMBULATORY_CARE_PROVIDER_SITE_OTHER): Payer: BLUE CROSS/BLUE SHIELD | Admitting: Obstetrics and Gynecology

## 2018-03-08 ENCOUNTER — Other Ambulatory Visit: Payer: Self-pay

## 2018-03-08 VITALS — BP 100/60 | HR 60 | Resp 14 | Ht 64.0 in | Wt 167.8 lb

## 2018-03-08 DIAGNOSIS — Z01419 Encounter for gynecological examination (general) (routine) without abnormal findings: Secondary | ICD-10-CM | POA: Diagnosis not present

## 2018-03-08 DIAGNOSIS — Z309 Encounter for contraceptive management, unspecified: Secondary | ICD-10-CM | POA: Diagnosis not present

## 2018-03-08 MED ORDER — ETONOGESTREL-ETHINYL ESTRADIOL 0.12-0.015 MG/24HR VA RING: 1.0000 | VAGINAL_RING | VAGINAL | 3 refills | Status: DC

## 2018-03-08 NOTE — Patient Instructions (Signed)

## 2018-03-09 LAB — CBC
HEMOGLOBIN: 13.4 g/dL (ref 11.1–15.9)
Hematocrit: 40 % (ref 34.0–46.6)
MCH: 30.4 pg (ref 26.6–33.0)
MCHC: 33.5 g/dL (ref 31.5–35.7)
MCV: 91 fL (ref 79–97)
PLATELETS: 196 10*3/uL (ref 150–450)
RBC: 4.41 x10E6/uL (ref 3.77–5.28)
RDW: 13.1 % (ref 12.3–15.4)
WBC: 4.2 10*3/uL (ref 3.4–10.8)

## 2018-03-09 LAB — COMPREHENSIVE METABOLIC PANEL
ALT: 28 IU/L (ref 0–32)
AST: 26 IU/L (ref 0–40)
Albumin/Globulin Ratio: 2.2 (ref 1.2–2.2)
Albumin: 4.9 g/dL (ref 3.5–5.5)
Alkaline Phosphatase: 75 IU/L (ref 39–117)
BUN/Creatinine Ratio: 11 (ref 9–23)
BUN: 9 mg/dL (ref 6–20)
Bilirubin Total: 0.6 mg/dL (ref 0.0–1.2)
CHLORIDE: 103 mmol/L (ref 96–106)
CO2: 24 mmol/L (ref 20–29)
Calcium: 9.5 mg/dL (ref 8.7–10.2)
Creatinine, Ser: 0.8 mg/dL (ref 0.57–1.00)
GFR calc non Af Amer: 101 mL/min/{1.73_m2} (ref >59)
GFR, EST AFRICAN AMERICAN: 116 mL/min/{1.73_m2} (ref >59)
Globulin, Total: 2.2 g/dL (ref 1.5–4.5)
Glucose: 91 mg/dL (ref 65–99)
Potassium: 4.2 mmol/L (ref 3.5–5.2)
Sodium: 140 mmol/L (ref 134–144)
Total Protein: 7.1 g/dL (ref 6.0–8.5)

## 2018-03-09 LAB — LIPID PANEL
Chol/HDL Ratio: 3.3 ratio (ref 0.0–4.4)
Cholesterol, Total: 150 mg/dL (ref 100–199)
HDL: 45 mg/dL (ref >39)
LDL Calculated: 87 mg/dL (ref 0–99)
Triglycerides: 91 mg/dL (ref 0–149)
VLDL Cholesterol Cal: 18 mg/dL (ref 5–40)

## 2018-03-09 LAB — HEMOGLOBIN A1C
Est. average glucose Bld gHb Est-mCnc: 100 mg/dL
Hgb A1c MFr Bld: 5.1 % (ref 4.8–5.6)

## 2018-03-09 LAB — TSH: TSH: 1.28 u[IU]/mL (ref 0.450–4.500)

## 2018-03-13 ENCOUNTER — Telehealth: Payer: Self-pay | Admitting: *Deleted

## 2018-03-13 NOTE — Telephone Encounter (Signed)
Call to patient to review results. Unable to leave message . Voice mail is full.

## 2018-03-13 NOTE — Telephone Encounter (Signed)
-----   Message from Patton SallesBrook E Amundson C Silva, MD sent at 03/11/2018  5:42 PM EDT ----- Please report normal results to patient.  This includes her CBC, CMP, TSH, lipids, and hemoglobin A1C.

## 2018-03-14 NOTE — Telephone Encounter (Signed)
Spoke with patient. Advised as seen below per Dr. Silva.  Patient verbalizes understanding and is agreeable.   Encounter closed.  

## 2018-03-14 NOTE — Telephone Encounter (Signed)
Patient returning call for a nurse for results.

## 2019-03-06 LAB — OB RESULTS CONSOLE GC/CHLAMYDIA
Chlamydia: NEGATIVE
Gonorrhea: NEGATIVE

## 2019-03-06 LAB — OB RESULTS CONSOLE ABO/RH: RH Type: POSITIVE

## 2019-03-06 LAB — OB RESULTS CONSOLE HIV ANTIBODY (ROUTINE TESTING): HIV: NONREACTIVE

## 2019-03-06 LAB — OB RESULTS CONSOLE RPR: RPR: NONREACTIVE

## 2019-03-06 LAB — OB RESULTS CONSOLE RUBELLA ANTIBODY, IGM: Rubella: IMMUNE

## 2019-03-06 LAB — OB RESULTS CONSOLE ANTIBODY SCREEN: Antibody Screen: NEGATIVE

## 2019-03-06 LAB — OB RESULTS CONSOLE HEPATITIS B SURFACE ANTIGEN: Hepatitis B Surface Ag: NEGATIVE

## 2019-03-28 ENCOUNTER — Ambulatory Visit: Payer: BLUE CROSS/BLUE SHIELD | Admitting: Obstetrics and Gynecology

## 2019-08-23 LAB — OB RESULTS CONSOLE GBS: GBS: NEGATIVE

## 2019-09-10 ENCOUNTER — Inpatient Hospital Stay (HOSPITAL_COMMUNITY)
Admission: AD | Admit: 2019-09-10 | Discharge: 2019-09-11 | Disposition: A | Discharge status: Home / Self Care | Payer: 59 | Attending: Obstetrics and Gynecology | Admitting: Obstetrics and Gynecology

## 2019-09-10 ENCOUNTER — Encounter (HOSPITAL_COMMUNITY): Payer: Self-pay | Admitting: *Deleted

## 2019-09-10 ENCOUNTER — Telehealth (HOSPITAL_COMMUNITY): Payer: Self-pay | Admitting: *Deleted

## 2019-09-10 ENCOUNTER — Inpatient Hospital Stay (HOSPITAL_BASED_OUTPATIENT_CLINIC_OR_DEPARTMENT_OTHER): Payer: 59

## 2019-09-10 ENCOUNTER — Encounter (HOSPITAL_COMMUNITY): Payer: Self-pay | Admitting: Obstetrics and Gynecology

## 2019-09-10 DIAGNOSIS — O99013 Anemia complicating pregnancy, third trimester: Secondary | ICD-10-CM | POA: Diagnosis not present

## 2019-09-10 DIAGNOSIS — E162 Hypoglycemia, unspecified: Secondary | ICD-10-CM | POA: Diagnosis not present

## 2019-09-10 DIAGNOSIS — Z87891 Personal history of nicotine dependence: Secondary | ICD-10-CM | POA: Insufficient documentation

## 2019-09-10 DIAGNOSIS — Z793 Long term (current) use of hormonal contraceptives: Secondary | ICD-10-CM | POA: Insufficient documentation

## 2019-09-10 DIAGNOSIS — O368190 Decreased fetal movements, unspecified trimester, not applicable or unspecified: Secondary | ICD-10-CM

## 2019-09-10 DIAGNOSIS — O09213 Supervision of pregnancy with history of pre-term labor, third trimester: Secondary | ICD-10-CM

## 2019-09-10 DIAGNOSIS — Z3A38 38 weeks gestation of pregnancy: Secondary | ICD-10-CM

## 2019-09-10 DIAGNOSIS — O24415 Gestational diabetes mellitus in pregnancy, controlled by oral hypoglycemic drugs: Secondary | ICD-10-CM | POA: Diagnosis not present

## 2019-09-10 DIAGNOSIS — O368130 Decreased fetal movements, third trimester, not applicable or unspecified: Secondary | ICD-10-CM | POA: Diagnosis present

## 2019-09-10 DIAGNOSIS — Z833 Family history of diabetes mellitus: Secondary | ICD-10-CM | POA: Diagnosis not present

## 2019-09-10 DIAGNOSIS — Z8249 Family history of ischemic heart disease and other diseases of the circulatory system: Secondary | ICD-10-CM | POA: Insufficient documentation

## 2019-09-10 DIAGNOSIS — Z20828 Contact with and (suspected) exposure to other viral communicable diseases: Secondary | ICD-10-CM | POA: Insufficient documentation

## 2019-09-10 LAB — PROTEIN / CREATININE RATIO, URINE
Creatinine, Urine: 22.46 mg/dL
Total Protein, Urine: <6 mg/dL

## 2019-09-10 LAB — CBC WITH DIFFERENTIAL/PLATELET
Abs Immature Granulocytes: 0.03 10*3/uL (ref 0.00–0.07)
Basophils Absolute: 0 10*3/uL (ref 0.0–0.1)
Basophils Relative: 0 %
Eosinophils Absolute: 0 10*3/uL (ref 0.0–0.5)
Eosinophils Relative: 1 %
HCT: 33.2 % — ABNORMAL LOW (ref 36.0–46.0)
Hemoglobin: 11.1 g/dL — ABNORMAL LOW (ref 12.0–15.0)
Immature Granulocytes: 1 %
Lymphocytes Relative: 16 %
Lymphs Abs: 1 10*3/uL (ref 0.7–4.0)
MCH: 29.8 pg (ref 26.0–34.0)
MCHC: 33.4 g/dL (ref 30.0–36.0)
MCV: 89 fL (ref 80.0–100.0)
Monocytes Absolute: 0.5 10*3/uL (ref 0.1–1.0)
Monocytes Relative: 8 %
Neutro Abs: 4.8 10*3/uL (ref 1.7–7.7)
Neutrophils Relative %: 74 %
Platelets: 120 10*3/uL — ABNORMAL LOW (ref 150–400)
RBC: 3.73 MIL/uL — ABNORMAL LOW (ref 3.87–5.11)
RDW: 13.1 % (ref 11.5–15.5)
WBC: 6.4 10*3/uL (ref 4.0–10.5)
nRBC: 0 % (ref 0.0–0.2)

## 2019-09-10 LAB — WET PREP, GENITAL
Clue Cells Wet Prep HPF POC: NONE SEEN
Sperm: NONE SEEN
Trich, Wet Prep: NONE SEEN
Yeast Wet Prep HPF POC: NONE SEEN

## 2019-09-10 LAB — COMPREHENSIVE METABOLIC PANEL
ALT: 11 U/L (ref 0–44)
AST: 17 U/L (ref 15–41)
Albumin: 2.7 g/dL — ABNORMAL LOW (ref 3.5–5.0)
Alkaline Phosphatase: 121 U/L (ref 38–126)
Anion gap: 9 (ref 5–15)
BUN: 9 mg/dL (ref 6–20)
CO2: 23 mmol/L (ref 22–32)
Calcium: 9.5 mg/dL (ref 8.9–10.3)
Chloride: 106 mmol/L (ref 98–111)
Creatinine, Ser: 0.6 mg/dL (ref 0.44–1.00)
GFR calc Af Amer: >60 mL/min (ref >60)
GFR calc non Af Amer: >60 mL/min (ref >60)
Glucose, Bld: 77 mg/dL (ref 70–99)
Potassium: 4 mmol/L (ref 3.5–5.1)
Sodium: 138 mmol/L (ref 135–145)
Total Bilirubin: <0.1 mg/dL — ABNORMAL LOW (ref 0.3–1.2)
Total Protein: 6 g/dL — ABNORMAL LOW (ref 6.5–8.1)

## 2019-09-10 LAB — GLUCOSE, CAPILLARY
Glucose-Capillary: 68 mg/dL — ABNORMAL LOW (ref 70–99)
Glucose-Capillary: 69 mg/dL — ABNORMAL LOW (ref 70–99)
Glucose-Capillary: 76 mg/dL (ref 70–99)
Glucose-Capillary: 81 mg/dL (ref 70–99)

## 2019-09-10 LAB — URINALYSIS, ROUTINE W REFLEX MICROSCOPIC
Bilirubin Urine: NEGATIVE
Glucose, UA: NEGATIVE mg/dL
Hgb urine dipstick: NEGATIVE
Ketones, ur: NEGATIVE mg/dL
Leukocytes,Ua: NEGATIVE
Nitrite: NEGATIVE
Protein, ur: NEGATIVE mg/dL
Specific Gravity, Urine: 1.004 — ABNORMAL LOW (ref 1.005–1.030)
pH: 6 (ref 5.0–8.0)

## 2019-09-10 LAB — AMNISURE RUPTURE OF MEMBRANE (ROM) NOT AT ARMC: Amnisure ROM: NEGATIVE

## 2019-09-10 IMAGING — US US MFM FETAL BPP W/O NON-STRESS
1 series · 15 of 16 positions shown · non-contrast
Comparison: none

[Series 1: us mfm fetal bpp w/o non-stress · 16 acquisitions, 15 frames shown]
[im 1/16]
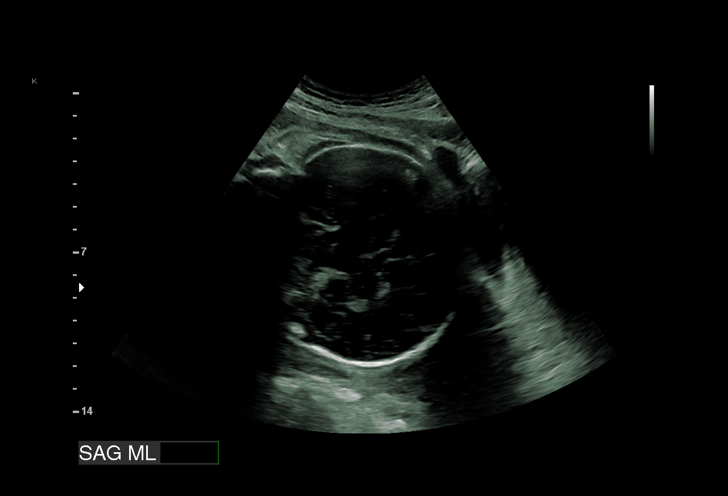
[im 2/16]
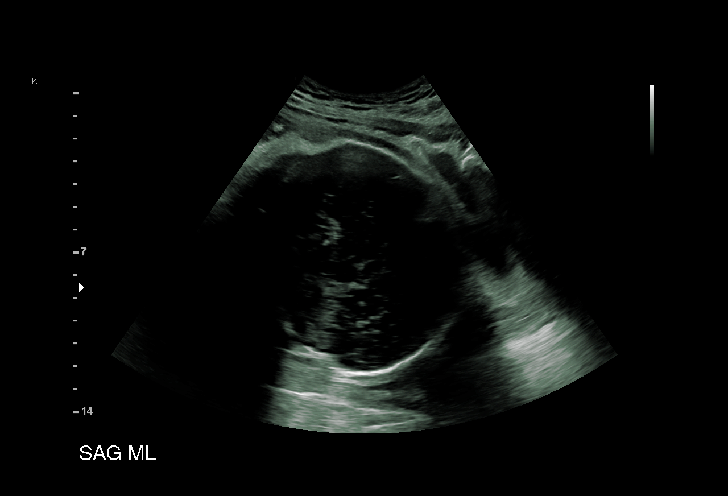
[im 3/16]
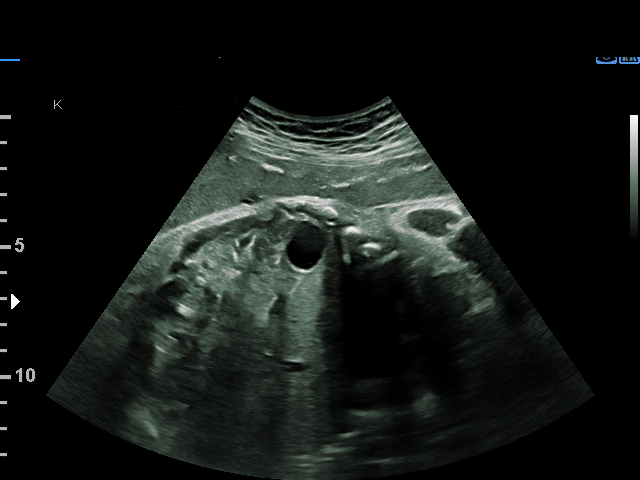
[im 4/16]
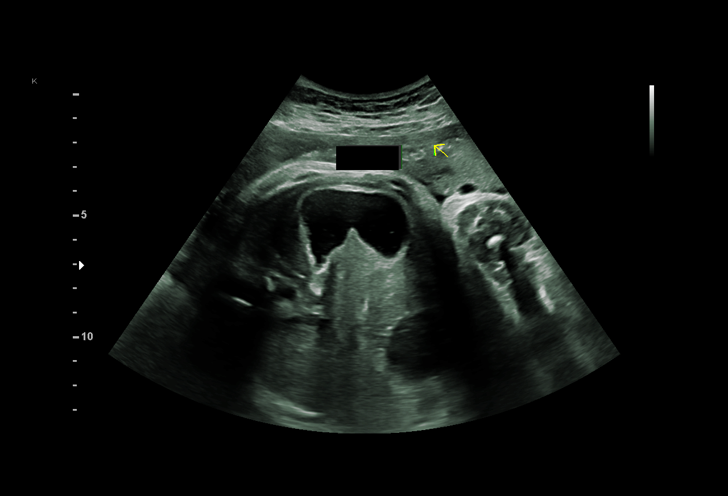
[im 5/16]
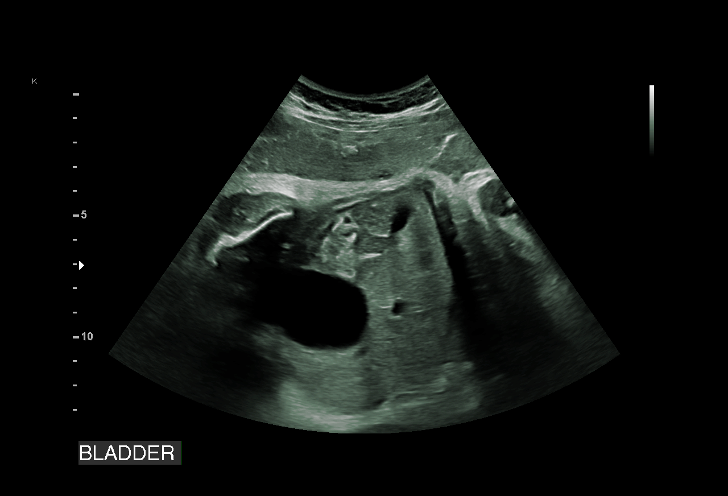
[im 6/16]
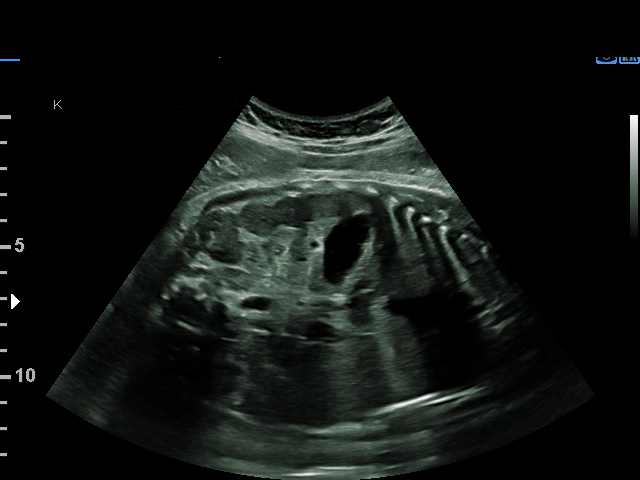
[im 7/16]
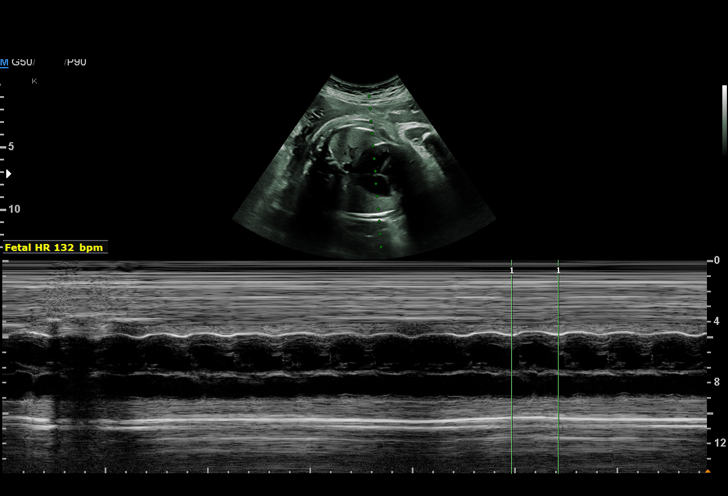
[im 9/16]
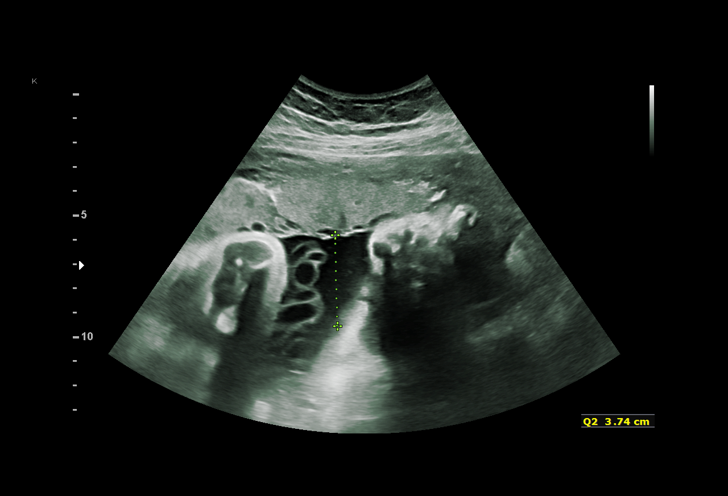
[im 10/16]
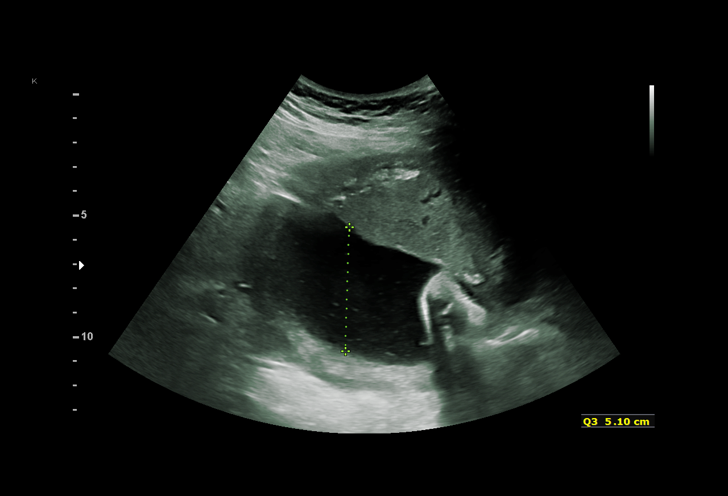
[im 11/16]
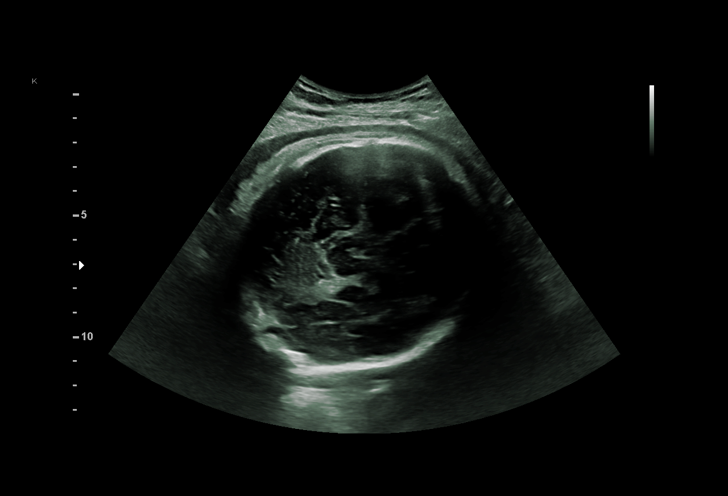
[im 12/16]
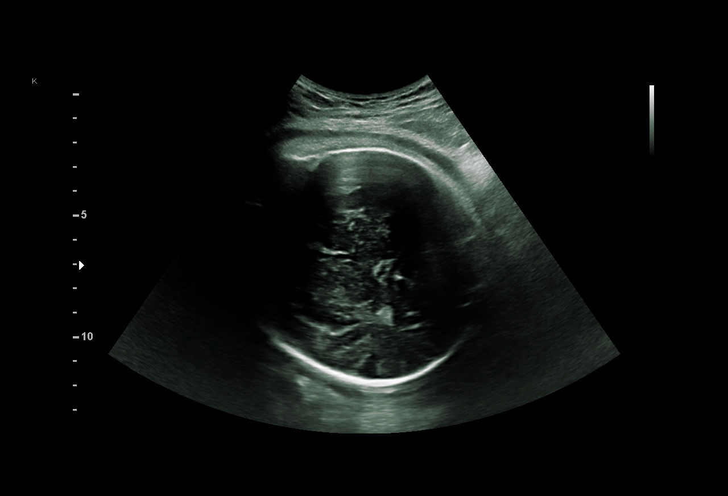
[im 13/16]
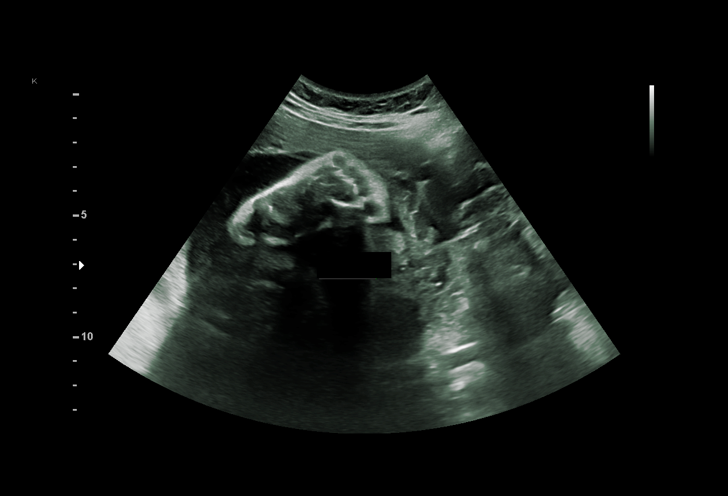
[im 14/16]
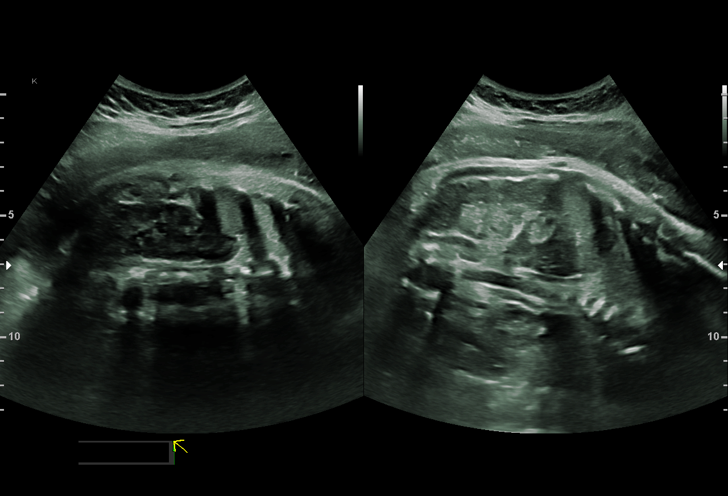
[im 15/16]
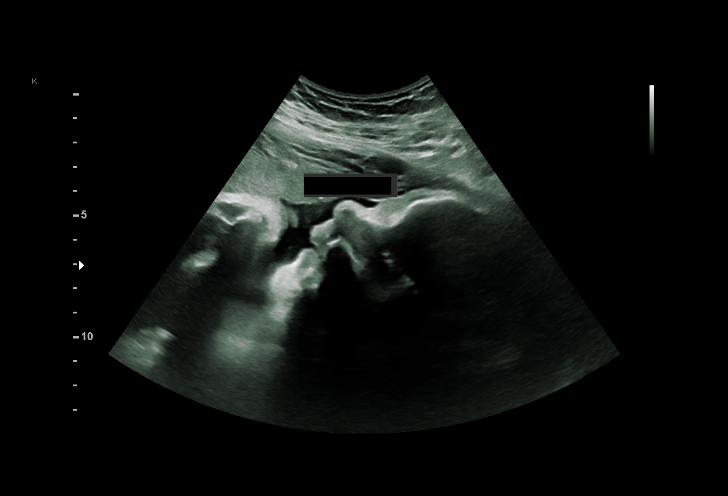
[im 16/16]
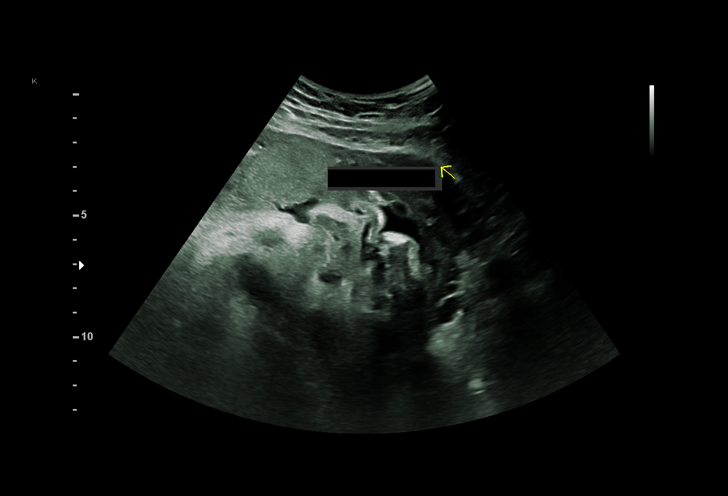

[15 of 16 positions shown; findings below may reference images not displayed]

RDMSB                                     MAU/Triage

 ----------------------------------------------------------------------

 ----------------------------------------------------------------------
Indications

  38 weeks gestation of pregnancy
  Decreased fetal movement                       [0P]
  Gestational diabetes in pregnancy,             [0P]
  controlled by oral hypoglycemic drugs
  Hypoglycemia
  Poor obstetric history: Previous preterm       [0P]
  delivery, antepartum 36 weeks
 ----------------------------------------------------------------------
Fetal Evaluation

 Num Of Fetuses:          1
 Fetal Heart Rate(bpm):   132
 Cardiac Activity:        Observed
 Presentation:            Cephalic

 Amniotic Fluid
 AFI FV:      Within normal limits

 AFI Sum(cm)     %Tile       Largest Pocket(cm)
 13.33           51

 RUQ(cm)                     LUQ(cm)        LLQ(cm)

Biophysical Evaluation

 Amniotic F.V:   Within normal limits       F. Tone:         Observed
 F. Movement:    Observed                   Score:           [DATE]
 F. Breathing:   Observed
OB History

 Gravidity:    21
 Living:       1
Gestational Age

 Clinical EDD:  38w 0d                                        EDD:   [DATE]
 Best:          38w 0d     Det. By:  Clinical EDD             EDD:   [DATE]
Anatomy

 Cranium:               Appears normal         Kidneys:                Appear normal
 Ventricles:            Appears normal         Bladder:                Appears normal
 Stomach:               Appears normal, left
                        sided
Impression

 Biophysical profile [DATE]
 Decreased fetal movement
 [0P]
 Good fetal movement and amniotic fluid
Recommendations

 Follow up per [facility] provider

## 2019-09-10 MED ORDER — DEXTROSE 5 % IN LACTATED RINGERS IV BOLUS
1000.0000 mL | Freq: Once | INTRAVENOUS | Status: AC
  Administered 2019-09-11: 1000 mL via INTRAVENOUS

## 2019-09-10 NOTE — Telephone Encounter (Signed)
Preadmission screen  

## 2019-09-10 NOTE — MAU Note (Addendum)
Patient with GDM presents to MAU c/o low blood sugar. Patient reports difficulty bring blood sugar back up. Patient reports eating peanut butter jelly sandwich, candy, and whole milk with no success of bringing back up.  Patient reports stomach feels tingly.  +FM, denies vaginal bleeding. Reports increase in thin/watery discharge.  Patient states her BP is high for her.

## 2019-09-10 NOTE — Progress Notes (Signed)
CRITICAL VALUE ALERT  Critical Value:  CBG 69  Date & Time Notied:  09/10/19 2302  Provider Notified: Mathis Dad  Orders Received/Actions taken: Crackers and peanutbutter given.

## 2019-09-10 NOTE — Progress Notes (Signed)
CRITICAL VALUE ALERT  Critical Value:  CBG 68  Date & Time Notied: 09/10/19 2319  Provider Notified: Mathis Dad  Orders Received/Actions taken: Crackers given

## 2019-09-10 NOTE — MAU Provider Note (Addendum)
History     CSN: [id number]  Arrival date and time: 09/10/19 2022   First Provider Initiated Contact with Patient 09/10/19 2052      Chief Complaint  Patient presents with  . Hypoglycemia   Priscilla Ryan is a 28 y.o. G2P0101 at [redacted]w[redacted]d who presents to MAU for low blood sugar reporting that "something was off and I felt really bad." Pt reports this was around 4PM today. Pt reports she feels normal now. Pt reports prior she felt shaky, nauseous, disoriented, foggy. Pt reports prior to this episode, she last ate around 130 - chicken, mashed potatoes and green beans. Pt reports when she felt this way she checked her blood sugar and it was 51. Then she ate a PB&J and drank a glass of whole milk and rechecked her blood sugar an hour later and her blood sugar was 70. About an hour later she took it again and it had dropped back down to 58, and patient reports she felt normal. On the way to MAU, pt checked and it was 70.  Pt denies anything out of the ordinary occurring today with no differences in eating and drinking. 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 Pt reports an increase in sugar intake around the holidays and reports an increase in other carbohydrates as well. Pt reports she has been eating a normal diet except for Christmas Eve and Christmas Day.  Pt also reports swelling in her hands and feet and that her blood pressure is "high for her."  Pt also reports her stomach sometimes feels "tingly" but denies feeling that at this time.  Pt reports DFM today during her episode of hypoglycemia, but reports fetal movement is almost back to normal now.  Pt also reports watery vaginal discharge for the past week that she reports is increased above normal.  Pt denies VB, LOF, ctx, vaginal odor/itching. Pt denies N/V, abdominal pain, constipation, diarrhea, or urinary problems. Pt denies fever, chills, fatigue,  sweating or changes in appetite. Pt denies SOB or chest pain. Pt denies dizziness, HA, light-headedness, weakness.  Problems this pregnancy include: GDM, low platelets, anemia Allergies? NKDA Current medications/supplements? Glyburide 5mg  BID (pt took this morning, but did not take tonight), PNVs Prenatal care provider? [facility], next appt 09/12/2019   OB History     Gravida  2   Para  1   Term  0   Preterm  1   AB  0   Living  1      SAB  0   TAB  0   Ectopic  0   Multiple  0   Live Births  1           Past Medical History:  Diagnosis Date  . Anemia   . Depression age 31   took Lexapro & had reaction with extreme fatigue no meds for 6 yrs.  . Encounter for insertion of mirena IUD 04/02/14  . Gestational diabetes    diet controlled  . ROM (rupture of membranes), premature 07/19/2013    History reviewed. No pertinent surgical history.  Family History  Problem Relation Age of Onset  . Heart failure Maternal Grandfather        heart stents  . Diabetes Maternal Grandfather     Social History   Tobacco Use  . Smoking status: Former Smoker    Quit date: 07/14/2012    Years  since quitting: 7.1  . Smokeless tobacco: Never Used  Substance Use Topics  . Alcohol use: Yes    Alcohol/week: 3.0 standard drinks    Types: 3 Standard drinks or equivalent per week  . Drug use: No    Allergies: No Known Allergies  Medications Prior to Admission  Medication Sig Dispense Refill Last Dose  . glyBURIDE (DIABETA) 5 MG tablet Take 5 mg by mouth daily with breakfast.   09/10/2019 at Unknown time  . Prenatal Vit-Fe Fumarate-FA (PRENATAL MULTIVITAMIN) TABS tablet Take 1 tablet by mouth daily at 12 noon.   09/09/2019 at Unknown time  . etonogestrel-ethinyl estradiol (NUVARING) 0.12-0.015 MG/24HR vaginal ring Place 1 each vaginally every 28 (twenty-eight) days. Insert vaginally and leave in place for 3 consecutive weeks, then remove for 1 week. 3 each 3   .  glucosamine-chondroitin 500-400 MG tablet Take 1 tablet by mouth 3 (three) times daily.     Marland Kitchen. levonorgestrel (MIRENA) 20 MCG/24HR IUD 1 each by Intrauterine route once.     . valACYclovir (VALTREX) 500 MG tablet Take 1 tablet by mouth as needed.  1 More than a month at Unknown time    Review of Systems  Constitutional: Negative for chills, diaphoresis, fatigue and fever.  Eyes: Negative for visual disturbance.  Respiratory: Negative for shortness of breath.   Cardiovascular: Negative for chest pain.  Gastrointestinal: Negative for abdominal pain, constipation, diarrhea, nausea and vomiting.  Genitourinary: Positive for vaginal discharge. Negative for dysuria, flank pain, frequency, pelvic pain, urgency and vaginal bleeding.  Musculoskeletal:       Swelling in hands and feet  Neurological: Negative for dizziness, weakness, light-headedness and headaches.   Physical Exam   Blood pressure 130/79, pulse 73, temperature 98.2 F (36.8 C), temperature source Oral, resp. rate 20, height 5\' 5"  (1.651 m), weight 85.9 kg, SpO2 100 %.  Patient Vitals for the past 24 hrs:  BP Temp Temp src Pulse Resp SpO2 Height Weight  09/10/19 2032 130/79 98.2 F (36.8 C) Oral 73 20 100 % 5\' 5"  (1.651 m) 85.9 kg   Physical Exam  Constitutional: She is oriented to person, place, and time. She appears well-developed and well-nourished. No distress.  HENT:  Head: Normocephalic and atraumatic.  Respiratory: Effort normal.  GI: Soft.  Genitourinary:    No vaginal discharge.   Neurological: She is alert and oriented to person, place, and time.  Skin: She is not diaphoretic.  Psychiatric: She has a normal mood and affect. Her behavior is normal. Judgment and thought content normal.   Results for orders placed or performed during the hospital encounter of 09/10/19 (from the past 24 hour(s))  Glucose, capillary     Status: None   Collection Time: 09/10/19  8:56 PM  Result Value Ref Range   Glucose-Capillary 76  70 - 99 mg/dL  [facility]     Status: None   Collection Time: 09/10/19  9:13 PM  Result Value Ref Range   Amnisure ROM NEGATIVE   Wet prep, genital     Status: Abnormal   Collection Time: 09/10/19  9:13 PM  Result Value Ref Range   Yeast Wet Prep HPF POC NONE SEEN NONE SEEN   Trich, Wet Prep NONE SEEN NONE SEEN   Clue Cells Wet Prep HPF POC NONE SEEN NONE SEEN   WBC, Wet Prep HPF POC FEW (A) NONE SEEN   Sperm NONE SEEN   Urinalysis, Routine w reflex microscopic  Status: Abnormal   Collection Time: 09/10/19  9:13 PM  Result Value Ref Range   Color, Urine STRAW (A) YELLOW   APPearance CLEAR CLEAR   Specific Gravity, Urine 1.004 (L) 1.005 - 1.030   pH 6.0 5.0 - 8.0   Glucose, UA NEGATIVE NEGATIVE mg/dL   Hgb urine dipstick NEGATIVE NEGATIVE   Bilirubin Urine NEGATIVE NEGATIVE   Ketones, ur NEGATIVE NEGATIVE mg/dL   Protein, ur NEGATIVE NEGATIVE mg/dL   Nitrite NEGATIVE NEGATIVE   Leukocytes,Ua NEGATIVE NEGATIVE  CBC with Differential/Platelet     Status: Abnormal   Collection Time: 09/10/19  9:31 PM  Result Value Ref Range   WBC 6.4 4.0 - 10.5 K/uL   RBC 3.73 (L) 3.87 - 5.11 MIL/uL   Hemoglobin 11.1 (L) 12.0 - 15.0 g/dL   HCT 33.2 (L) 36.0 - 46.0 %   MCV 89.0 80.0 - 100.0 fL   MCH 29.8 26.0 - 34.0 pg   MCHC 33.4 30.0 - 36.0 g/dL   RDW 13.1 11.5 - 15.5 %   Platelets 120 (L) 150 - 400 K/uL   nRBC 0.0 0.0 - 0.2 %   Neutrophils Relative % 74 %   Neutro Abs 4.8 1.7 - 7.7 K/uL   Lymphocytes Relative 16 %   Lymphs Abs 1.0 0.7 - 4.0 K/uL   Monocytes Relative 8 %   Monocytes Absolute 0.5 0.1 - 1.0 K/uL   Eosinophils Relative 1 %   Eosinophils Absolute 0.0 0.0 - 0.5 K/uL   Basophils Relative 0 %   Basophils Absolute 0.0 0.0 - 0.1 K/uL   Immature Granulocytes 1 %   Abs Immature Granulocytes 0.03 0.00 - 0.07 K/uL   MAU Course  Procedures  MDM -pt presents for hypoglycemia in setting of GDM, swelling in hands/feet, increased BP relative  to her normal blood pressures, decreased fetal movement, pt has known thrombocytopenia and anemia and occasional tingling in stomach -pt given clicker and advised to push each time she feels fetal movement -pt given iced water, graham crackers and peanut butter -pt also reports tingling in her abdomen on occasion, but denies this symptom today -care transferred to Dr. Darene Lamer @957PM  Carolyne Fiscal, Gerrie Nordmann, NP  9:58 PM 09/10/2019   Orders Placed This Encounter  Procedures  . Wet prep, genital    Standing Status:   Standing    Number of Occurrences:   1  . US Fetal BPP WO NST Addl Gestation    Standing Status:   Standing    Number of Occurrences:   1    Order Specific Question:   Symptom/Reason for Exam    Answer:   Decreased fetal movement [id number]  . Amnisure rupture of membrane (rom)not at [facility]    Standing Status:   Standing    Number of Occurrences:   1  . Glucose, capillary    Standing Status:   Standing    Number of Occurrences:   1  . Comprehensive metabolic panel    Standing Status:   Standing    Number of Occurrences:   1  . Protein / creatinine ratio, urine    Standing Status:   Standing    Number of Occurrences:   1  . Urinalysis, Routine w reflex microscopic    Standing Status:   Standing    Number of Occurrences:   1  . CBC with Differential/Platelet    Standing Status:   Standing    Number of Occurrences:   1   No orders of  the defined types were placed in this encounter.  Care assumed from Donia Ast, NP  Results for orders placed or performed during the hospital encounter of 09/10/19 (from the past 24 hour(s))  Glucose, capillary     Status: None   Collection Time: 09/10/19  8:56 PM  Result Value Ref Range   Glucose-Capillary 76 70 - 99 mg/dL  Amnisure rupture of membrane (rom)not at [facility]     Status: None   Collection Time: 09/10/19  9:13 PM  Result Value Ref Range   Amnisure ROM NEGATIVE   Wet prep, genital     Status: Abnormal   Collection Time:  09/10/19  9:13 PM  Result Value Ref Range   Yeast Wet Prep HPF POC NONE SEEN NONE SEEN   Trich, Wet Prep NONE SEEN NONE SEEN   Clue Cells Wet Prep HPF POC NONE SEEN NONE SEEN   WBC, Wet Prep HPF POC FEW (A) NONE SEEN   Sperm NONE SEEN   Urinalysis, Routine w reflex microscopic     Status: Abnormal   Collection Time: 09/10/19  9:13 PM  Result Value Ref Range   Color, Urine STRAW (A) YELLOW   APPearance CLEAR CLEAR   Specific Gravity, Urine 1.004 (L) 1.005 - 1.030   pH 6.0 5.0 - 8.0   Glucose, UA NEGATIVE NEGATIVE mg/dL   Hgb urine dipstick NEGATIVE NEGATIVE   Bilirubin Urine NEGATIVE NEGATIVE   Ketones, ur NEGATIVE NEGATIVE mg/dL   Protein, ur NEGATIVE NEGATIVE mg/dL   Nitrite NEGATIVE NEGATIVE   Leukocytes,Ua NEGATIVE NEGATIVE  CBC with Differential/Platelet     Status: Abnormal   Collection Time: 09/10/19  9:31 PM  Result Value Ref Range   WBC 6.4 4.0 - 10.5 K/uL   RBC 3.73 (L) 3.87 - 5.11 MIL/uL   Hemoglobin 11.1 (L) 12.0 - 15.0 g/dL   HCT 16.1 (L) 09.6 - 04.5 %   MCV 89.0 80.0 - 100.0 fL   MCH 29.8 26.0 - 34.0 pg   MCHC 33.4 30.0 - 36.0 g/dL   RDW 40.9 81.1 - 91.4 %   Platelets 120 (L) 150 - 400 K/uL   nRBC 0.0 0.0 - 0.2 %   Neutrophils Relative % 74 %   Neutro Abs 4.8 1.7 - 7.7 K/uL   Lymphocytes Relative 16 %   Lymphs Abs 1.0 0.7 - 4.0 K/uL   Monocytes Relative 8 %   Monocytes Absolute 0.5 0.1 - 1.0 K/uL   Eosinophils Relative 1 %   Eosinophils Absolute 0.0 0.0 - 0.5 K/uL   Basophils Relative 0 %   Basophils Absolute 0.0 0.0 - 0.1 K/uL   Immature Granulocytes 1 %   Abs Immature Granulocytes 0.03 0.00 - 0.07 K/uL   RECHECK 2315 -Preliminary report for BPP 8/8.   -CBGs in the 60-70s despite peanut butter, crackers, drinks -Patient reports contractions increasing in pain and frequency -Repeat SVE: 3/20/-3, making cervical change  NST -baseline: 135 -variability: moderate -accels: 15x15 -decels: none -interpretation: reactive -TOCO: CTX q3-4  minutes  Assessment and Plan  28 yo G2P0101 at 38.[redacted] weeks EGA who presented to MAU with hypogylcemia, then started contracting and making cervical change -BPP 8/8, Cat I tracing, Baby VTX by SVE and Leopold's -Advised DC glyburide, and start D5W for glycemic control with 2 hour CBGs -Spoke to Rhea Pink, who will admit  Marlowe Alt, DO OB Fellow, [facility] 09/10/2019 11:37 PM

## 2019-09-11 LAB — GC/CHLAMYDIA PROBE AMP (~~LOC~~) NOT AT ARMC
Chlamydia: NEGATIVE
Comment: NEGATIVE
Comment: NORMAL
Neisseria Gonorrhea: NEGATIVE

## 2019-09-11 LAB — GLUCOSE, CAPILLARY: Glucose-Capillary: 115 mg/dL — ABNORMAL HIGH (ref 70–99)

## 2019-09-11 LAB — TYPE AND SCREEN
ABO/RH(D): B POS
Antibody Screen: NEGATIVE

## 2019-09-11 LAB — RESPIRATORY PANEL BY RT PCR (FLU A&B, COVID)
Influenza A by PCR: NEGATIVE
Influenza B by PCR: NEGATIVE
SARS Coronavirus 2 by RT PCR: NEGATIVE

## 2019-09-11 LAB — ABO/RH: ABO/RH(D): B POS

## 2019-09-11 MED ORDER — LACTATED RINGERS IV SOLN: INTRAVENOUS | Status: DC

## 2019-09-11 NOTE — Progress Notes (Signed)
Priscilla Ryan 28 y.o. [redacted]w[redacted]d G2P0101 A2DM on Glyguride 5mg  PO BID seen and treated in MAU for low blood sugar. Hand-off received from Dr. Darene Lamer w/ recommendation for admission for cervical change and low blood glucose despite food. Consulted with my attending and plan of care developed to bolus w/ D5LR and repeat SVE in 2 hours. Will d/c home w/ labor precautions, hypoglycemic precautions, and close follow-up.    Rapid COVID-19 test was done when admission to L&D was part of the plan of care.   S:  Pt reports feeling better, active FM, and a decrease in frequency and intensity of ctx. Denies LOF and vaginal bleeding. Next appt in 12/30 w/ [facility].   O: CBG (last 3)  Recent Labs    09/10/19 2319 09/10/19 2344 09/11/19 0208  GLUCAP 68* 81 115*   Vitals:   09/10/19 2032 09/11/19 0236  BP: 130/79 114/69  Pulse: 73 79  Temp: 98.2 F (36.8 C) 98.2 F (36.8 C)  Resp: 20 16  Height: 5\' 5"  (1.651 m)   Weight: 85.9 kg   SpO2: 100% 100%  TempSrc: Oral Oral  BMI (Calculated): 31.52    CMP     Component Value Date/Time   NA 138 09/10/2019 2131   NA 140 03/08/2018 1202   K 4.0 09/10/2019 2131   CL 106 09/10/2019 2131   CO2 23 09/10/2019 2131   GLUCOSE 77 09/10/2019 2131   BUN 9 09/10/2019 2131   BUN 9 03/08/2018 1202   CREATININE 0.60 09/10/2019 2131   CALCIUM 9.5 09/10/2019 2131   PROT 6.0 (L) 09/10/2019 2131   PROT 7.1 03/08/2018 1202   ALBUMIN 2.7 (L) 09/10/2019 2131   ALBUMIN 4.9 03/08/2018 1202   AST 17 09/10/2019 2131   ALT 11 09/10/2019 2131   ALKPHOS 121 09/10/2019 2131   BILITOT <0.1 (L) 09/10/2019 2131   BILITOT 0.6 03/08/2018 1202   GFRNONAA >60 09/10/2019 2131   GFRAA >60 09/10/2019 2131   CBC    Component Value Date/Time   WBC 6.4 09/10/2019 2131   RBC 3.73 (L) 09/10/2019 2131   HGB 11.1 (L) 09/10/2019 2131   HGB 13.4 03/08/2018 1202   HGB 12.6 08/17/2016 1107   HCT 33.2 (L) 09/10/2019 2131   HCT 40.0 03/08/2018 1202   PLT 120 (L) 09/10/2019 2131   PLT 196 03/08/2018 1202   MCV 89.0 09/10/2019 2131   MCV 91 03/08/2018 1202   MCH 29.8 09/10/2019 2131   MCHC 33.4 09/10/2019 2131   RDW 13.1 09/10/2019 2131   RDW 13.1 03/08/2018 1202   LYMPHSABS 1.0 09/10/2019 2131   MONOABS 0.5 09/10/2019 2131   EOSABS 0.0 09/10/2019 2131   BASOSABS 0.0 09/10/2019 2131   Dilation: 3 Effacement (%): 20 Station: -3 Presentation: Vertex Exam by:: Burman Foster, CNM  A/P: Duncan Dull 28 y.o. [redacted]w[redacted]d G2P0101 A2DM    -managed w/Glyburide 5mg  PO BID    -hold AM dose Hypoglycemic episode    -CBG in normal range Early labor    -no cervical change in past 2.5 hrs Call office AM for same day appt if CBG<70 after meal OR keep appt on 12/30 if CBG stable Labor precautions Fetal kick counts  Burman Foster, MSN, CNM 09/11/2019 2:47 AM

## 2019-09-13 ENCOUNTER — Other Ambulatory Visit: Payer: Self-pay | Admitting: Obstetrics & Gynecology

## 2019-09-13 ENCOUNTER — Other Ambulatory Visit: Payer: Self-pay | Admitting: Obstetrics and Gynecology

## 2019-09-14 NOTE — L&D Delivery Note (Signed)
Delivery Note Labor onset: 09/18/2019  Labor Onset Time: 1330 Complete dilation at 7:00 PM  Onset of pushing: Involuntary pushing @ 1825 FHR second stage Cat 1 Analgesia/Anesthesia intrapartum: IV sedation  Pt reports strong urge to push and began involuntarily pushing @ 8cm. Bright red bleeding noted w/ small clots while pushing. FHR remains Cat 1 and soft resting tone per palpation. Delivery of a viable female at 62. Fetal head delivered in OA position compounded by left hand, with arm in scarf position. Nuchal cord--none. Infant w/ lusty cry immediately after birth, placed on maternal abd, dried, and tactile stim.  Cord double clamped and cut after 5 min by father, Terese Door.  Cord blood sample collected Arterial cord blood sample N/A.  Placenta delivered Tomasa Blase, intact, with 3 VC.  Placenta to L&D. Uterine tone firm, bleeding brisk, Cytotec 800 mcg rectal, Cytotec 200 mcg buccal  No laceration identified.  Anesthesia: none Repair: N/A QBL (mL): 110 Complications: uterine atony in the third stage, resolved w/ Pitocin and Cytotec APGAR: APGAR (1 MIN): 8   APGAR (5 MINS): 9   APGAR (10 MINS):   Mom to postpartum.  Baby to Couplet care / Skin to Skin.  Roma Schanz MSN, CNM 09/18/2019, 8:15 PM

## 2019-09-15 ENCOUNTER — Other Ambulatory Visit (HOSPITAL_COMMUNITY)
Admission: RE | Admit: 2019-09-15 | Discharge: 2019-09-15 | Disposition: A | Discharge status: Home / Self Care | Payer: 59 | Source: Ambulatory Visit | Attending: Obstetrics & Gynecology | Admitting: Obstetrics & Gynecology

## 2019-09-15 DIAGNOSIS — Z20822 Contact with and (suspected) exposure to covid-19: Secondary | ICD-10-CM | POA: Insufficient documentation

## 2019-09-15 DIAGNOSIS — Z01812 Encounter for preprocedural laboratory examination: Secondary | ICD-10-CM | POA: Insufficient documentation

## 2019-09-15 LAB — SARS CORONAVIRUS 2 (TAT 6-24 HRS): SARS Coronavirus 2: NEGATIVE

## 2019-09-17 NOTE — H&P (Signed)
Priscilla Ryan is a 29 y.o. female, G2P0101, IUP at 39 weeks, presenting for IOL for GDMA2 on glyburide 5mg  BID, recent hypoglycemia. PNC consist of: anemia of pregnancy (Hgb 10.9 at 28 weeks), gestational diabetes mellitus (Glyburide started at 29 weeks), history of gestational diabetes mellitus (first pregnancy), history of premature delivery (declined mekena), primary thrombocytopenia (Plat 145 at NOB, last plat were 116 on 08/23/2019), GBS Negative. Low risk female. EFW on 09/12/2019 was 7.7lbs, BPP 8/8.  Pt endorse + Fm. Denies vaginal leakage. Denies vaginal bleeding. Denies feeling cxt's.   Patient Active Problem List   Diagnosis Date Noted  . IUD (intrauterine device) in place 04/02/2014   No medications prior to admission.   Past Medical History:  Diagnosis Date  . Anemia   . Depression age 83   took Lexapro & had reaction with extreme fatigue no meds for 6 yrs.  . Encounter for insertion of mirena IUD 04/02/14  . Gestational diabetes    diet controlled  . ROM (rupture of membranes), premature 07/19/2013     No current facility-administered medications on file prior to encounter.   Current Outpatient Medications on File Prior to Encounter  Medication Sig Dispense Refill  . etonogestrel-ethinyl estradiol (NUVARING) 0.12-0.015 MG/24HR vaginal ring Place 1 each vaginally every 28 (twenty-eight) days. Insert vaginally and leave in place for 3 consecutive weeks, then remove for 1 week. 3 each 3  . valACYclovir (VALTREX) 500 MG tablet Take 1 tablet by mouth as needed.  1     No Known Allergies  History of present pregnancy: Pt Info/Preference:  Screening/Consents:  Labs:   EDD: Estimated Date of Delivery: 09/24/19  Establised: No LMP recorded. Patient is pregnant.  Anatomy Scan: Date: 05/17/2019 Placenta Location: anterior Genetic Screen: Panoroma:low risk female AFP:  First Tri: Quad:  Office: [facility]            First PNV: 11 wg Blood Type --/--/B POS, B POS Performed at [facility], [address], [place], [place]  (12/28 2131)  Language: 2132 Last PNV: 38 wg Rhogam    Flu Vaccine:  declined   Antibody NEG (12/28 2131)  TDaP vaccine declined   GTT: Early: 5.0 Third Trimester: Unable to find result  Feeding Plan: breast BTL: no Rubella: Immune (06/23 0000)  Contraception: ??? VBAC: no RPR: Nonreactive (06/23 0000)   Circumcision: N/A   HBsAg: Negative (06/23 0000)  Pediatrician:  ???   HIV: Non-reactive (06/23 0000)   Prenatal Classes: no Additional 07-[birth year]-1973: Yes, growth and BPP at 12/30 8/8 and see below GBS:  (For PCN allergy, check sensitivities)       Chlamydia: neg    MFM Referral/Consult:  GC: neg  Support Person: husband   PAP: 2020-normal  Pain Management: epidural Neonatologist Referral:  Hgb Electrophoresis:  AA  Birth Plan: none   Hgb NOB: 11.9    28W: 11.1  09/12/2019 growth and BPP:  OB History    Gravida  2   Para  1   Term  0   Preterm  1   AB  0   Living  1     SAB  0   TAB  0   Ectopic  0   Multiple  0   Live Births  1          Past Medical History:  Diagnosis Date  . Anemia   . Depression age 33   took Lexapro & had reaction with extreme fatigue no meds  for 6 yrs.  . Encounter for insertion of mirena IUD 04/02/14  . Gestational diabetes    diet controlled  . ROM (rupture of membranes), premature 07/19/2013   No past surgical history on file. Family History: family history includes Diabetes in her maternal grandfather; Heart failure in her maternal grandfather. Social History:  reports that she quit smoking about 7 years ago. She has never used smokeless tobacco. She reports current alcohol use of about 3.0 standard drinks of alcohol per week. She reports that she does not use drugs.   Prenatal Transfer Tool  Maternal Diabetes: Yes:  Diabetes Type:  Insulin/Medication controlled Genetic Screening: Normal Maternal Ultrasounds/Referrals: Normal Fetal Ultrasounds or other Referrals:  None Maternal  Substance Abuse:  No Significant Maternal Medications:  Meds include: Other: Glyburide Significant Maternal Lab Results: Group B Strep negative  ROS:  Review of Systems  Constitutional: Negative.   HENT: Negative.   Eyes: Negative.   Respiratory: Negative.   Cardiovascular: Negative.   Gastrointestinal: Positive for abdominal pain.       Irregular cxt   Genitourinary: Negative.   Musculoskeletal: Negative.   Skin: Negative.   Neurological: Negative.   Endo/Heme/Allergies: Negative.   Psychiatric/Behavioral: Negative.      Physical Exam: There were no vitals taken for this visit.  Physical Exam  Constitutional: She is oriented to person, place, and time and well-developed, well-nourished, and in no distress.  HENT:  Head: Normocephalic and atraumatic.  Eyes: Pupils are equal, round, and reactive to light. Conjunctivae are normal.  Cardiovascular: Normal rate and regular rhythm.  Pulmonary/Chest: Effort normal and breath sounds normal.  Abdominal: Soft. Bowel sounds are normal.  Genitourinary:    Genitourinary Comments: uterus gravida, uterus soft non-tender, pelvis adequate for vaginal deliver, pelvis proven to 6.7lbs @ 34 weeks   Musculoskeletal:        General: Normal range of motion.     Cervical back: Normal range of motion and neck supple.  Neurological: She is alert and oriented to person, place, and time. Gait normal.  Skin: Skin is warm and dry.  Psychiatric: Affect normal.  Nursing note and vitals reviewed.    NST: FHR baseline 145 bpm, Variability: moderate, Accelerations:present, Decelerations:  Absent= Cat 1/Reactive UC:   irregular, OCC SVE: 3/50/-3   , vertex verified by fetal sutures.  Leopold's: Position vertex, EFW 8lbs via leopold's.   Labs: No results found for this or any previous visit (from the past 24 hour(s)).  Imaging:  [place] MFM FETAL BPP WO NON STRESS  Result Date:  09/11/2019 ----------------------------------------------------------------------  OBSTETRICS REPORT                        (Signed Final 09/11/2019 08:13 am) ---------------------------------------------------------------------- Patient Info  ID #:       [id number]                          D.O.B.:  1990/10/02 (28 yrs)  Name:       Jordan Hawks Wach                  Visit Date: 09/10/2019 10:01 pm ---------------------------------------------------------------------- Performed By  Performed By:     Felecia Jan        Referred By:       MAU Nursing-                    RDMSB  MAU/Triage  Attending:        Lin Landsman      [facility] ---------------------------------------------------------------------- Orders   #  Description                          Code         Ordered By   1  [place] MFM FETAL BPP WO NON              76819.01     NICOLE NUGENT      STRESS  ----------------------------------------------------------------------   #  Order #                    Accession #                 Episode #   1  [id number]                  [id number]                  [id number]  ---------------------------------------------------------------------- Indications   [redacted] weeks gestation of pregnancy                Z3A.38   Decreased fetal movement                       O36.8190   Gestational diabetes in pregnancy,             O24.415   controlled by oral hypoglycemic drugs   Hypoglycemia   Poor obstetric history: Previous preterm       O09.219   delivery, antepartum 36 weeks  ---------------------------------------------------------------------- Fetal Evaluation  Num Of Fetuses:          1  Fetal Heart Rate(bpm):   132  Cardiac Activity:        Observed  Presentation:            Cephalic  Amniotic Fluid  AFI FV:      Within normal limits  AFI Sum(cm)     %Tile       Largest Pocket(cm)  13.33           51           5.1  RUQ(cm)                     LUQ(cm)        LLQ(cm)  4.49                        3.74           5.1 ---------------------------------------------------------------------- Biophysical Evaluation  Amniotic F.V:   Within normal limits       F. Tone:         Observed  F. Movement:    Observed                   Score:           8/8  F. Breathing:  Observed ---------------------------------------------------------------------- OB History  Gravidity:    21  Living:       1 ---------------------------------------------------------------------- Gestational Age  Clinical EDD:  38w 0d                                        EDD:   09/24/19  Best:          38w 0d     Det. By:  Clinical EDD             EDD:   09/24/19 ---------------------------------------------------------------------- Anatomy  Cranium:               Appears normal         Kidneys:                Appear normal  Ventricles:            Appears normal         Bladder:                Appears normal  Stomach:               Appears normal, left                         sided ---------------------------------------------------------------------- Impression  Biophysical profile 8/8  Decreased fetal movement  A2GDM  Good fetal movement and amniotic fluid ---------------------------------------------------------------------- Recommendations  Follow up per MAU provider ----------------------------------------------------------------------               Lin Landsman, MD Electronically Signed Final Report   09/11/2019 08:13 am ----------------------------------------------------------------------   MAU Course: No orders of the defined types were placed in this encounter.  No orders of the defined types were placed in this encounter.   Assessment/Plan: Brookley Spitler is a 30 y.o. female, G2P0101, IUP at 32 weeks, presenting for IOL for GDMA2 on glyburide 5mg  BID, recent hypoglycemia. PNC consist of: anemia of pregnancy (Hgb 10.9 at 28 weeks),  gestational diabetes mellitus (Glyburide started at 29 weeks), history of gestational diabetes mellitus (first pregnancy), history of premature delivery (declined mekena), primary thrombocytopenia (Plat 145 at NOB, last plat were 116 on 08/23/2019), GBS Negative. Low risk female. EFW on 09/12/2019 was 7.7lbs, BPP 8/8. Pelvis proven to 6.7lbs last pregnancy @ 34 weeks.  Pt endorse + Fm. Denies vaginal leakage. Denies vaginal bleeding. Denies feeling cxt's.   FWB: Cat 1 Fetal Tracing.   Plan: Admit to Birthing Suite per consult with Dr 09/14/2019 Routine [facility] orders Plan for pitocin 2x2 if cervix is ripe  Pain med/epidural prn GDMA2: Discontinue glyburide, CBG Q4H and Q2H in active labor.  Anticipate AROM when fetus engaged  Anticipate labor progression   Su Hilt NP-C, CNM, MSN 09/18/2019, 7:17 AM

## 2019-09-18 ENCOUNTER — Encounter (HOSPITAL_COMMUNITY): Payer: Self-pay | Admitting: Obstetrics and Gynecology

## 2019-09-18 ENCOUNTER — Other Ambulatory Visit: Payer: Self-pay

## 2019-09-18 ENCOUNTER — Inpatient Hospital Stay (HOSPITAL_COMMUNITY)
Admission: AD | Admit: 2019-09-18 | Discharge: 2019-09-20 | DRG: 806 | Disposition: A | Discharge status: Home / Self Care | Payer: 59 | Attending: Obstetrics & Gynecology | Admitting: Obstetrics & Gynecology

## 2019-09-18 ENCOUNTER — Inpatient Hospital Stay (HOSPITAL_COMMUNITY): Payer: 59

## 2019-09-18 DIAGNOSIS — O9902 Anemia complicating childbirth: Secondary | ICD-10-CM | POA: Diagnosis present

## 2019-09-18 DIAGNOSIS — Z20822 Contact with and (suspected) exposure to covid-19: Secondary | ICD-10-CM | POA: Diagnosis present

## 2019-09-18 DIAGNOSIS — Z87891 Personal history of nicotine dependence: Secondary | ICD-10-CM | POA: Diagnosis not present

## 2019-09-18 DIAGNOSIS — D649 Anemia, unspecified: Secondary | ICD-10-CM | POA: Diagnosis present

## 2019-09-18 DIAGNOSIS — Z880 Allergy status to penicillin: Secondary | ICD-10-CM | POA: Diagnosis not present

## 2019-09-18 DIAGNOSIS — O24419 Gestational diabetes mellitus in pregnancy, unspecified control: Secondary | ICD-10-CM | POA: Diagnosis present

## 2019-09-18 DIAGNOSIS — Z3A39 39 weeks gestation of pregnancy: Secondary | ICD-10-CM | POA: Diagnosis not present

## 2019-09-18 DIAGNOSIS — O24425 Gestational diabetes mellitus in childbirth, controlled by oral hypoglycemic drugs: Principal | ICD-10-CM | POA: Diagnosis present

## 2019-09-18 DIAGNOSIS — O24415 Gestational diabetes mellitus in pregnancy, controlled by oral hypoglycemic drugs: Secondary | ICD-10-CM

## 2019-09-18 LAB — GLUCOSE, CAPILLARY
Glucose-Capillary: 80 mg/dL (ref 70–99)
Glucose-Capillary: 119 mg/dL — ABNORMAL HIGH (ref 70–99)
Glucose-Capillary: 58 mg/dL — ABNORMAL LOW (ref 70–99)
Glucose-Capillary: 87 mg/dL (ref 70–99)
Glucose-Capillary: 106 mg/dL — ABNORMAL HIGH (ref 70–99)

## 2019-09-18 LAB — CBC
HCT: 34.6 % — ABNORMAL LOW (ref 36.0–46.0)
Hemoglobin: 11.1 g/dL — ABNORMAL LOW (ref 12.0–15.0)
MCH: 29.2 pg (ref 26.0–34.0)
MCHC: 32.1 g/dL (ref 30.0–36.0)
MCV: 91.1 fL (ref 80.0–100.0)
Platelets: 117 10*3/uL — ABNORMAL LOW (ref 150–400)
RBC: 3.8 MIL/uL — ABNORMAL LOW (ref 3.87–5.11)
RDW: 13 % (ref 11.5–15.5)
WBC: 5.3 10*3/uL (ref 4.0–10.5)
nRBC: 0 % (ref 0.0–0.2)

## 2019-09-18 LAB — TYPE AND SCREEN
ABO/RH(D): B POS
Antibody Screen: NEGATIVE

## 2019-09-18 LAB — RPR: RPR Ser Ql: NONREACTIVE

## 2019-09-18 MED ORDER — DIBUCAINE (PERIANAL) 1 % EX OINT: 1.0000 "application " | TOPICAL_OINTMENT | CUTANEOUS | Status: DC | PRN

## 2019-09-18 MED ORDER — FENTANYL CITRATE (PF) 100 MCG/2ML IJ SOLN
50.0000 ug | INTRAMUSCULAR | Status: DC | PRN
  Administered 2019-09-18: 100 ug via INTRAVENOUS
  Filled 2019-09-18 (×2): qty 2

## 2019-09-18 MED ORDER — DIPHENHYDRAMINE HCL 25 MG PO CAPS: 25.0000 mg | ORAL_CAPSULE | Freq: Four times a day (QID) | ORAL | Status: DC | PRN

## 2019-09-18 MED ORDER — MISOPROSTOL 25 MCG QUARTER TABLET: 25.0000 ug | ORAL_TABLET | ORAL | Status: DC | PRN

## 2019-09-18 MED ORDER — SOD CITRATE-CITRIC ACID 500-334 MG/5ML PO SOLN: 30.0000 mL | ORAL | Status: DC | PRN

## 2019-09-18 MED ORDER — IBUPROFEN 600 MG PO TABS
600.0000 mg | ORAL_TABLET | Freq: Four times a day (QID) | ORAL | Status: DC
  Administered 2019-09-19 – 2019-09-20 (×7): 600 mg via ORAL
  Filled 2019-09-18 (×7): qty 1

## 2019-09-18 MED ORDER — FLEET ENEMA 7-19 GM/118ML RE ENEM: 1.0000 | ENEMA | RECTAL | Status: DC | PRN

## 2019-09-18 MED ORDER — TETANUS-DIPHTH-ACELL PERTUSSIS 5-2.5-18.5 LF-MCG/0.5 IM SUSP: 0.5000 mL | Freq: Once | INTRAMUSCULAR | Status: DC

## 2019-09-18 MED ORDER — ACETAMINOPHEN 325 MG PO TABS
650.0000 mg | ORAL_TABLET | ORAL | Status: DC | PRN
  Administered 2019-09-19 – 2019-09-20 (×5): 650 mg via ORAL
  Filled 2019-09-18 (×5): qty 2

## 2019-09-18 MED ORDER — ONDANSETRON HCL 4 MG PO TABS: 4.0000 mg | ORAL_TABLET | ORAL | Status: DC | PRN

## 2019-09-18 MED ORDER — MISOPROSTOL 200 MCG PO TABS: 800.0000 ug | ORAL_TABLET | Freq: Once | ORAL | Status: AC

## 2019-09-18 MED ORDER — MISOPROSTOL 200 MCG PO TABS
ORAL_TABLET | ORAL | Status: AC
  Administered 2019-09-18: 800 ug via RECTAL
  Filled 2019-09-18: qty 5

## 2019-09-18 MED ORDER — ONDANSETRON HCL 4 MG/2ML IJ SOLN: 4.0000 mg | Freq: Four times a day (QID) | INTRAMUSCULAR | Status: DC | PRN

## 2019-09-18 MED ORDER — FERROUS SULFATE 325 (65 FE) MG PO TABS
325.0000 mg | ORAL_TABLET | Freq: Two times a day (BID) | ORAL | Status: DC
  Administered 2019-09-19 – 2019-09-20 (×2): 325 mg via ORAL
  Filled 2019-09-18 (×2): qty 1

## 2019-09-18 MED ORDER — OXYTOCIN 40 UNITS IN NORMAL SALINE INFUSION - SIMPLE MED
1.0000 m[IU]/min | INTRAVENOUS | Status: DC
  Administered 2019-09-18: 1 m[IU]/min via INTRAVENOUS
  Filled 2019-09-18: qty 1000

## 2019-09-18 MED ORDER — LACTATED RINGERS IV SOLN: 500.0000 mL | INTRAVENOUS | Status: DC | PRN

## 2019-09-18 MED ORDER — BENZOCAINE-MENTHOL 20-0.5 % EX AERO
1.0000 "application " | INHALATION_SPRAY | CUTANEOUS | Status: DC | PRN
  Administered 2019-09-18: 1 via TOPICAL
  Filled 2019-09-18: qty 56

## 2019-09-18 MED ORDER — LACTATED RINGERS IV SOLN: INTRAVENOUS | Status: DC

## 2019-09-18 MED ORDER — SIMETHICONE 80 MG PO CHEW: 80.0000 mg | CHEWABLE_TABLET | ORAL | Status: DC | PRN

## 2019-09-18 MED ORDER — LIDOCAINE HCL (PF) 1 % IJ SOLN: 30.0000 mL | INTRAMUSCULAR | Status: DC | PRN

## 2019-09-18 MED ORDER — OXYTOCIN 40 UNITS IN NORMAL SALINE INFUSION - SIMPLE MED
2.5000 [IU]/h | INTRAVENOUS | Status: DC
  Administered 2019-09-18: 2.5 [IU]/h via INTRAVENOUS

## 2019-09-18 MED ORDER — SENNOSIDES-DOCUSATE SODIUM 8.6-50 MG PO TABS
2.0000 | ORAL_TABLET | ORAL | Status: DC
  Administered 2019-09-19 – 2019-09-20 (×2): 2 via ORAL
  Filled 2019-09-18 (×2): qty 2

## 2019-09-18 MED ORDER — ONDANSETRON HCL 4 MG/2ML IJ SOLN: 4.0000 mg | INTRAMUSCULAR | Status: DC | PRN

## 2019-09-18 MED ORDER — WITCH HAZEL-GLYCERIN EX PADS: 1.0000 "application " | MEDICATED_PAD | CUTANEOUS | Status: DC | PRN

## 2019-09-18 MED ORDER — ACETAMINOPHEN 325 MG PO TABS: 650.0000 mg | ORAL_TABLET | ORAL | Status: DC | PRN

## 2019-09-18 MED ORDER — PRENATAL MULTIVITAMIN CH
1.0000 | ORAL_TABLET | Freq: Every day | ORAL | Status: DC
  Administered 2019-09-19 – 2019-09-20 (×2): 1 via ORAL
  Filled 2019-09-18 (×2): qty 1

## 2019-09-18 MED ORDER — COCONUT OIL OIL: 1.0000 "application " | TOPICAL_OIL | Status: DC | PRN

## 2019-09-18 MED ORDER — MISOPROSTOL 200 MCG PO TABS
200.0000 ug | ORAL_TABLET | Freq: Once | ORAL | Status: DC
  Administered 2019-09-18: 200 ug via BUCCAL

## 2019-09-18 MED ORDER — OXYTOCIN BOLUS FROM INFUSION
500.0000 mL | Freq: Once | INTRAVENOUS | Status: AC
  Administered 2019-09-18: 500 mL via INTRAVENOUS

## 2019-09-18 MED ORDER — TERBUTALINE SULFATE 1 MG/ML IJ SOLN: 0.2500 mg | Freq: Once | INTRAMUSCULAR | Status: DC | PRN

## 2019-09-18 NOTE — Progress Notes (Signed)
Subjective:    Coping well w/ ctx. Plans labor and birth w/o epidural.   Objective:    VS: BP 109/70   Pulse 81   Temp 97.9 F (36.6 C) (Axillary)   Resp 18   Ht 5\' 5"  (1.651 m)   Wt 86.2 kg   BMI 31.62 kg/m  FHR : baseline 140 / variability moderate / accelerations present / absnet decelerations Toco: contractions every 3-4 minutes Membranes: Intact Dilation: 3 Effacement (%): 70 Station: -2 Presentation: Vertex Exam by:: Foley,rn  Assessment/Plan:   28 y.o. [id number] [redacted]w[redacted]d  Labor: Progressing on Pitocin, will continue to increase then AROM  A2DM: Discontinue glyburide, CBG Q4H and Q2H in active labor Preeclampsia:  no signs or symptoms of toxicity Fetal Wellbeing:  Category I Pain Control:  Labor support without medications I/D:  GBS negative Anticipated MOD:  NSVD  [redacted]w[redacted]d CNM, MSN 09/18/2019 11:42 AM

## 2019-09-18 NOTE — Progress Notes (Signed)
S: Continues to cope well w/ labor  O: Dilation: 3 Effacement (%): 70 Station: -2 Presentation: Vertex Exam by:: Jaesean Litzau,cnm  CBG (last 3)  Recent Labs    09/18/19 0916 09/18/19 1300  GLUCAP 80 58*     A/P: Labor-AROM, scant clear, continue to increase Pitocin A2DM: continue CBG Q4 and then Q2 in active labor Cat 1 fetal surveillance Anticipate NSVD  Rhea Pink, MSN, CNM 09/18/2019 2:24 PM

## 2019-09-19 ENCOUNTER — Encounter (HOSPITAL_COMMUNITY): Payer: Self-pay | Admitting: Obstetrics and Gynecology

## 2019-09-19 LAB — CBC
HCT: 28.1 % — ABNORMAL LOW (ref 36.0–46.0)
Hemoglobin: 9.7 g/dL — ABNORMAL LOW (ref 12.0–15.0)
MCH: 30.4 pg (ref 26.0–34.0)
MCHC: 34.5 g/dL (ref 30.0–36.0)
MCV: 88.1 fL (ref 80.0–100.0)
Platelets: 126 10*3/uL — ABNORMAL LOW (ref 150–400)
RBC: 3.19 MIL/uL — ABNORMAL LOW (ref 3.87–5.11)
RDW: 12.9 % (ref 11.5–15.5)
WBC: 6.4 10*3/uL (ref 4.0–10.5)
nRBC: 0 % (ref 0.0–0.2)

## 2019-09-19 LAB — GLUCOSE, CAPILLARY
Glucose-Capillary: 128 mg/dL — ABNORMAL HIGH (ref 70–99)
Glucose-Capillary: 96 mg/dL (ref 70–99)
Glucose-Capillary: 135 mg/dL — ABNORMAL HIGH (ref 70–99)
Glucose-Capillary: 125 mg/dL — ABNORMAL HIGH (ref 70–99)

## 2019-09-19 MED ORDER — OXYCODONE HCL 5 MG PO TABS
5.0000 mg | ORAL_TABLET | Freq: Once | ORAL | Status: AC
  Administered 2019-09-19: 5 mg via ORAL
  Filled 2019-09-19: qty 1

## 2019-09-19 NOTE — Progress Notes (Signed)
MOB was referred for history of depression/anxiety.  * Referral screened out by Clinical Social Worker because none of the following criteria appear to apply:  ~ History of anxiety/depression during this pregnancy, or of post-partum depression following prior delivery. ~ Diagnosis of anxiety and/or depression within last 3 years. Per chart review, MOB diagnosed at age 29. No concerns noted in [facility] records.  OR * MOB's symptoms currently being treated with medication and/or therapy.  Please contact the Clinical Social Worker if needs arise, by [facility] request, or if MOB scores greater than 9/yes to question 10 on Edinburgh Postpartum Depression Screen.  Lear Ng, LCSW [facility] [phone]

## 2019-09-19 NOTE — Progress Notes (Signed)
PPD# 1 SVD w/ intact perineum Information for the patient's newborn:  Savita, Runner Girl Turkey [[id number]]  female    Baby Name [facility] Circumcision N/A   S:   Reports feeling good Tolerating PO fluid and solids No nausea or vomiting Bleeding is light, no clots Pain controlled with PO meds Up ad lib / ambulatory / voiding w/o difficulty Feeding: Breast    O:   VS: BP 112/81 (BP Location: Left Arm)   Pulse 73   Temp 98.4 F (36.9 C) (Oral)   Resp 18   Ht 5\' 5"  (1.651 m)   Wt 86.2 kg   SpO2 99%   Breastfeeding Unknown   BMI 31.62 kg/m   LABS:  Recent Labs    09/18/19 0834 09/19/19 0526  WBC 5.3 6.4  HGB 11.1* 9.7*  PLT 117* 126*   CBG (last 3)  Recent Labs    09/18/19 1658 09/18/19 2052 09/19/19 0635  GLUCAP 87 106* 128*    Blood type: --/--/B POS (01/05 10-03-1983) Rubella: Immune (06/23 0000)                      I&O: Intake/Output      01/05 0701 - 01/06 0700 01/06 0701 - 01/07 0700   I.V. (mL/kg) 240 (2.8)    Total Intake(mL/kg) 240 (2.8)    Urine (mL/kg/hr) 0    Blood 110    Total Output 110    Net +130         Urine Occurrence 2 x      Physical Exam: Alert and oriented X3 Lungs: Clear and unlabored Heart: regular rate and rhythm / no mumurs Abdomen: soft, non-tender, non-distended  Fundus: firm, non-tender Perineum: intact, non-edematous Lochia: apprpriate Extremities: no edema, no calf pain or tenderness    A:  PPD # 1 Normal exam A2DM-glyburide dc'd fasting and 2hr PP CBG   P:  Routine post partum orders Anticipate D/C on 09/20/19   Plan reviewed w/ Dr. 11/18/19, MSN, CNM 09/19/2019, 7:09 AM

## 2019-09-19 NOTE — Lactation Note (Signed)
This note was copied from a baby's chart. Lactation Consultation Note  Patient Name: Priscilla Ryan Date: 09/19/2019 Reason for consult: Initial assessment;Nipple pain/trauma;Term;Maternal endocrine disorder Type of Endocrine Disorder?: Diabetes  Visited with P2 Mom of term baby at 63 hrs old.  This baby has latched and fed 9 times already. 3 voids and 3 stools. Mom is an experienced breastfeeder for 9 months with her now 29 yr old.  Mom has fair skin, and had sore nipples with first baby.  Nipples are pink and slight blistering noted on nipple tip.  Mom using EBM and coconut oil on nipples.   Mom resting on her side, and FOB holding baby as she is sleeping.  Reviewed what a deep latch to the breast would look like and how it should feel.  Mom describes baby having deep jaw extensions with her suck.  Recommended she call out for her RN when baby starts cueing, to have the latch assessed.  Encouraged STS and feeding baby often with cues.  Lactation brochure left in room.  Parents aware of IP and OP lactation support available them.   Consult Status Consult Status: Follow-up Date: 09/20/19 Follow-up type: In-patient    Priscilla Ryan 09/19/2019, 2:47 PM

## 2019-09-20 LAB — GLUCOSE, CAPILLARY: Glucose-Capillary: 87 mg/dL (ref 70–99)

## 2019-09-20 NOTE — Discharge Summary (Signed)
OB Discharge Summary     Patient Name: Priscilla Ryan DOB: 31-Mar-1991 MRN: [id number]  Date of admission: 09/18/2019 Delivering MD: Burman Foster B   Date of discharge: 09/20/2019  Admitting diagnosis: Gestational diabetes mellitus (GDM) in third trimester [O24.419] Intrauterine pregnancy: [redacted]w[redacted]d     Secondary diagnosis:  Active Problems:   Gestational diabetes mellitus (GDM) in third trimester   SVD (1/5)  Additional problems: none     Discharge diagnosis: Term Pregnancy Delivered and GDM A2                                                                                                Post partum procedures:none  Augmentation: AROM and Pitocin  Complications: None  Hospital course:  Induction of Labor With Vaginal Delivery   29 y.o. yo L2G4010 at [redacted]w[redacted]d was admitted to the hospital 09/18/2019 for induction of labor.  Indication for induction: A2 DM.  Patient had an uncomplicated labor course as follows: Membrane Rupture Time/Date: 1:30 PM ,09/18/2019   Intrapartum Procedures: Episiotomy: None [1]                                         Lacerations:  None [1]  Patient had delivery of a Viable infant.  Information for the patient's newborn:  Artemisa, Sladek [[id number]]  Delivery Method: Vaginal, Spontaneous(Filed from Delivery Summary)    09/18/2019  Details of delivery can be found in separate delivery note.  Patient had a routine postpartum course. Patient is discharged home 09/20/19.  Physical exam  Vitals:   09/19/19 0349 09/19/19 1005 09/19/19 1458 09/20/19 0522  BP: 112/81 105/74 104/67 110/79  Pulse: 73 75 76 85  Resp: 18  17 18   Temp: 98.4 F (36.9 C) 97.9 F (36.6 C) 97.9 F (36.6 C) 98.3 F (36.8 C)  TempSrc: Oral Oral Oral Oral  SpO2: 99%  100% 99%  Weight:      Height:       General: alert, cooperative and no distress Lochia: appropriate Uterine Fundus: firm Perineum: intact, non-edematous DVT Evaluation: No evidence of DVT seen on physical exam. No  cords or calf tenderness. No significant calf/ankle edema. Labs: Lab Results  Component Value Date   WBC 6.4 09/19/2019   HGB 9.7 (L) 09/19/2019   HCT 28.1 (L) 09/19/2019   MCV 88.1 09/19/2019   PLT 126 (L) 09/19/2019   CMP Latest Ref Rng & Units 09/10/2019  Glucose 70 - 99 mg/dL 77  BUN 6 - 20 mg/dL 9  Creatinine 0.44 - 1.00 mg/dL 0.60  Sodium 135 - 145 mmol/L 138  Potassium 3.5 - 5.1 mmol/L 4.0  Chloride 98 - 111 mmol/L 106  CO2 22 - 32 mmol/L 23  Calcium 8.9 - 10.3 mg/dL 9.5  Total Protein 6.5 - 8.1 g/dL 6.0(L)  Total Bilirubin 0.3 - 1.2 mg/dL <0.1(L)  Alkaline Phos 38 - 126 U/L 121  AST 15 - 41 U/L 17  ALT 0 - 44 U/L 11    Discharge instruction: per  After Visit Summary and "Baby and Me Booklet".  After visit meds:  Allergies as of 09/20/2019   No Known Allergies     Medication List    STOP taking these medications   etonogestrel-ethinyl estradiol 0.12-0.015 MG/24HR vaginal ring Commonly known as: NuvaRing   glyBURIDE 5 MG tablet Commonly known as: DIABETA   valACYclovir 500 MG tablet Commonly known as: VALTREX       Diet: carb modified diet  Activity: Advance as tolerated. Pelvic rest for 6 weeks.   Outpatient follow up:6 weeks Follow up Appt:No future appointments. Follow up Visit:No follow-ups on file.  Postpartum contraception: Undecided  Newborn Data: Live born female  Birth Weight: 7 lb 7.6 oz (3391 g) APGAR: 8, 9  Newborn Delivery   Birth date/time: 09/18/2019 19:42:00 Delivery type: Vaginal, Spontaneous      Baby Feeding: Breast Disposition:home with mother   09/20/2019 Roma Schanz, CNM

## 2019-09-20 NOTE — Lactation Note (Signed)
This note was copied from a baby's chart. Lactation Consultation Note  Patient Name: Priscilla Ryan [id number]'[id number] Date: 09/20/2019 Reason for consult: Follow-up assessment;Infant weight loss;Other (Comment);Nipple pain/trauma(5 % weight loss) Type of Endocrine Disorder?: Diabetes  Baby is 37 hours old - at 33 hours Bili 8.3  Mom resting in bed holding baby and per mom has been cluster feeding and  My nipples are sore.  LC assessed breast tissue with moms permission and noted both nipples to be pinky red and a tiny abrasion on the left.  LC reviewed sore  Nipple and engorgement prevention and tx  LC recommended due to soreness / prior to every latch - breast massage, hand express, prepump to prime the milk ducts and firm support with breast compressions until swallows.  LC instructed mom on the use of hand pump with #24 F and #27 F And breast shells ( while awake )  alternating with comfort gels.  Mom requested a NS , LC sized her for a #24 NS and it was a good fit.  LC recommended if she finds her self having to use the NS for latch to call back for and LC O/P appt or if the soreness doesn't clear in 4 days.  Mom aware of the [facility] O/P services.  Baby sound asleep / LC unable to check latch.  LC reminded mom to feed with cues and due to soreness and abby cluster feeding during the night to not to allow the baby to go over 3 hours days and evenings without feeding.     Maternal Data Has patient been taught Hand Expression?: Yes  Feeding Feeding Type: (per mom baby has been cluster feeding all night)  LATCH Score                   Interventions Interventions: Breast feeding basics reviewed  Lactation Tools Discussed/Used Tools: Shells;Pump;Flanges;Coconut oil;Comfort gels;Nipple Shields(mom requested a NS just incase - Sized for #24) Flange Size: 24;27 Shell Type: Inverted Breast pump type: Manual Pump Review: Milk Storage;Setup, frequency, and cleaning Initiated by::  MAI Date initiated:: 09/20/19   Consult Status Consult Status: Complete Date: 09/20/19    [name] Cookie Pore 09/20/2019, 9:05 AM

## 2020-10-14 DIAGNOSIS — R5383 Other fatigue: Secondary | ICD-10-CM | POA: Diagnosis not present

## 2020-10-14 DIAGNOSIS — R051 Acute cough: Secondary | ICD-10-CM | POA: Diagnosis not present

## 2020-10-14 DIAGNOSIS — Z20828 Contact with and (suspected) exposure to other viral communicable diseases: Secondary | ICD-10-CM | POA: Diagnosis not present

## 2020-10-14 DIAGNOSIS — R0981 Nasal congestion: Secondary | ICD-10-CM | POA: Diagnosis not present

## 2020-10-19 DIAGNOSIS — Z20828 Contact with and (suspected) exposure to other viral communicable diseases: Secondary | ICD-10-CM | POA: Diagnosis not present

## 2020-10-19 DIAGNOSIS — M791 Myalgia, unspecified site: Secondary | ICD-10-CM | POA: Diagnosis not present

## 2021-02-03 DIAGNOSIS — Z0000 Encounter for general adult medical examination without abnormal findings: Secondary | ICD-10-CM | POA: Diagnosis not present

## 2021-02-06 DIAGNOSIS — R5383 Other fatigue: Secondary | ICD-10-CM | POA: Diagnosis not present

## 2021-02-06 DIAGNOSIS — Z0000 Encounter for general adult medical examination without abnormal findings: Secondary | ICD-10-CM | POA: Diagnosis not present

## 2021-02-06 DIAGNOSIS — Z1331 Encounter for screening for depression: Secondary | ICD-10-CM | POA: Diagnosis not present

## 2021-02-06 DIAGNOSIS — R82998 Other abnormal findings in urine: Secondary | ICD-10-CM | POA: Diagnosis not present

## 2021-03-04 DIAGNOSIS — D485 Neoplasm of uncertain behavior of skin: Secondary | ICD-10-CM | POA: Diagnosis not present

## 2021-03-04 DIAGNOSIS — D2262 Melanocytic nevi of left upper limb, including shoulder: Secondary | ICD-10-CM | POA: Diagnosis not present

## 2021-03-04 DIAGNOSIS — D2371 Other benign neoplasm of skin of right lower limb, including hip: Secondary | ICD-10-CM | POA: Diagnosis not present

## 2021-03-04 DIAGNOSIS — L84 Corns and callosities: Secondary | ICD-10-CM | POA: Diagnosis not present

## 2021-03-04 DIAGNOSIS — D225 Melanocytic nevi of trunk: Secondary | ICD-10-CM | POA: Diagnosis not present

## 2021-08-26 DIAGNOSIS — R051 Acute cough: Secondary | ICD-10-CM | POA: Diagnosis not present

## 2021-08-26 DIAGNOSIS — J069 Acute upper respiratory infection, unspecified: Secondary | ICD-10-CM | POA: Diagnosis not present

## 2021-08-26 DIAGNOSIS — Z20828 Contact with and (suspected) exposure to other viral communicable diseases: Secondary | ICD-10-CM | POA: Diagnosis not present

## 2022-01-04 DIAGNOSIS — F411 Generalized anxiety disorder: Secondary | ICD-10-CM | POA: Diagnosis not present

## 2022-02-02 DIAGNOSIS — L259 Unspecified contact dermatitis, unspecified cause: Secondary | ICD-10-CM | POA: Diagnosis not present

## 2022-05-06 DIAGNOSIS — M25561 Pain in right knee: Secondary | ICD-10-CM | POA: Diagnosis not present

## 2022-07-19 DIAGNOSIS — R7989 Other specified abnormal findings of blood chemistry: Secondary | ICD-10-CM | POA: Diagnosis not present

## 2022-07-26 DIAGNOSIS — R002 Palpitations: Secondary | ICD-10-CM | POA: Diagnosis not present

## 2022-07-26 DIAGNOSIS — Z1331 Encounter for screening for depression: Secondary | ICD-10-CM | POA: Diagnosis not present

## 2022-07-26 DIAGNOSIS — Z0000 Encounter for general adult medical examination without abnormal findings: Secondary | ICD-10-CM | POA: Diagnosis not present

## 2022-10-12 DIAGNOSIS — J101 Influenza due to other identified influenza virus with other respiratory manifestations: Secondary | ICD-10-CM | POA: Diagnosis not present

## 2022-10-12 DIAGNOSIS — R051 Acute cough: Secondary | ICD-10-CM | POA: Diagnosis not present

## 2022-10-12 DIAGNOSIS — F330 Major depressive disorder, recurrent, mild: Secondary | ICD-10-CM | POA: Diagnosis not present

## 2022-10-25 DIAGNOSIS — H1013 Acute atopic conjunctivitis, bilateral: Secondary | ICD-10-CM | POA: Diagnosis not present

## 2022-11-10 DIAGNOSIS — F330 Major depressive disorder, recurrent, mild: Secondary | ICD-10-CM | POA: Diagnosis not present

## 2022-12-15 DIAGNOSIS — F330 Major depressive disorder, recurrent, mild: Secondary | ICD-10-CM | POA: Diagnosis not present

## 2023-01-26 DIAGNOSIS — F330 Major depressive disorder, recurrent, mild: Secondary | ICD-10-CM | POA: Diagnosis not present

## 2023-02-22 DIAGNOSIS — F330 Major depressive disorder, recurrent, mild: Secondary | ICD-10-CM | POA: Diagnosis not present

## 2023-04-05 DIAGNOSIS — F330 Major depressive disorder, recurrent, mild: Secondary | ICD-10-CM | POA: Diagnosis not present

## 2023-05-05 DIAGNOSIS — F330 Major depressive disorder, recurrent, mild: Secondary | ICD-10-CM | POA: Diagnosis not present

## 2023-05-11 DIAGNOSIS — K080 Exfoliation of teeth due to systemic causes: Secondary | ICD-10-CM | POA: Diagnosis not present

## 2023-05-12 DIAGNOSIS — L600 Ingrowing nail: Secondary | ICD-10-CM | POA: Diagnosis not present

## 2023-06-14 DIAGNOSIS — F4322 Adjustment disorder with anxiety: Secondary | ICD-10-CM | POA: Diagnosis not present

## 2023-06-14 DIAGNOSIS — F330 Major depressive disorder, recurrent, mild: Secondary | ICD-10-CM | POA: Diagnosis not present

## 2023-07-27 DIAGNOSIS — F4322 Adjustment disorder with anxiety: Secondary | ICD-10-CM | POA: Diagnosis not present

## 2023-07-27 DIAGNOSIS — F330 Major depressive disorder, recurrent, mild: Secondary | ICD-10-CM | POA: Diagnosis not present

## 2023-07-28 DIAGNOSIS — R7989 Other specified abnormal findings of blood chemistry: Secondary | ICD-10-CM | POA: Diagnosis not present

## 2023-08-02 DIAGNOSIS — Z0000 Encounter for general adult medical examination without abnormal findings: Secondary | ICD-10-CM | POA: Diagnosis not present

## 2023-08-02 DIAGNOSIS — F325 Major depressive disorder, single episode, in full remission: Secondary | ICD-10-CM | POA: Diagnosis not present

## 2023-08-02 DIAGNOSIS — Z1331 Encounter for screening for depression: Secondary | ICD-10-CM | POA: Diagnosis not present

## 2023-09-20 DIAGNOSIS — F4322 Adjustment disorder with anxiety: Secondary | ICD-10-CM | POA: Diagnosis not present

## 2023-09-20 DIAGNOSIS — F330 Major depressive disorder, recurrent, mild: Secondary | ICD-10-CM | POA: Diagnosis not present

## 2023-10-19 DIAGNOSIS — F330 Major depressive disorder, recurrent, mild: Secondary | ICD-10-CM | POA: Diagnosis not present

## 2023-10-19 DIAGNOSIS — F4322 Adjustment disorder with anxiety: Secondary | ICD-10-CM | POA: Diagnosis not present

## 2023-11-23 DIAGNOSIS — F330 Major depressive disorder, recurrent, mild: Secondary | ICD-10-CM | POA: Diagnosis not present

## 2023-11-23 DIAGNOSIS — F4322 Adjustment disorder with anxiety: Secondary | ICD-10-CM | POA: Diagnosis not present

## 2024-02-15 DIAGNOSIS — F330 Major depressive disorder, recurrent, mild: Secondary | ICD-10-CM | POA: Diagnosis not present

## 2024-02-24 DIAGNOSIS — L259 Unspecified contact dermatitis, unspecified cause: Secondary | ICD-10-CM | POA: Diagnosis not present

## 2024-02-24 DIAGNOSIS — R21 Rash and other nonspecific skin eruption: Secondary | ICD-10-CM | POA: Diagnosis not present

## 2024-05-09 DIAGNOSIS — F3341 Major depressive disorder, recurrent, in partial remission: Secondary | ICD-10-CM | POA: Diagnosis not present

## 2024-08-02 DIAGNOSIS — Z79899 Other long term (current) drug therapy: Secondary | ICD-10-CM | POA: Diagnosis not present

## 2024-08-02 DIAGNOSIS — D649 Anemia, unspecified: Secondary | ICD-10-CM | POA: Diagnosis not present

## 2024-08-02 DIAGNOSIS — D644 Congenital dyserythropoietic anemia: Secondary | ICD-10-CM | POA: Diagnosis not present

## 2024-08-07 DIAGNOSIS — R002 Palpitations: Secondary | ICD-10-CM | POA: Diagnosis not present

## 2024-08-07 DIAGNOSIS — Z0000 Encounter for general adult medical examination without abnormal findings: Secondary | ICD-10-CM | POA: Diagnosis not present

## 2024-08-07 DIAGNOSIS — Z1331 Encounter for screening for depression: Secondary | ICD-10-CM | POA: Diagnosis not present

## 2024-08-07 DIAGNOSIS — R82998 Other abnormal findings in urine: Secondary | ICD-10-CM | POA: Diagnosis not present
